# Patient Record
Sex: Female | Born: 1990 | Hispanic: Yes | Marital: Single | Smoking: Never smoker
Health system: Southern US, Community
[De-identification: ages and names within clinical notes are randomized; demographics above are authoritative.]

## PROBLEM LIST (undated history)

## (undated) ENCOUNTER — Inpatient Hospital Stay (HOSPITAL_COMMUNITY): Payer: Self-pay

## (undated) DIAGNOSIS — O0930 Supervision of pregnancy with insufficient antenatal care, unspecified trimester: Secondary | ICD-10-CM

## (undated) DIAGNOSIS — Z789 Other specified health status: Secondary | ICD-10-CM

## (undated) DIAGNOSIS — F329 Major depressive disorder, single episode, unspecified: Secondary | ICD-10-CM

## (undated) DIAGNOSIS — F32A Depression, unspecified: Secondary | ICD-10-CM

## (undated) DIAGNOSIS — Z86718 Personal history of other venous thrombosis and embolism: Secondary | ICD-10-CM

## (undated) HISTORY — DX: Major depressive disorder, single episode, unspecified: F32.9

## (undated) HISTORY — DX: Supervision of pregnancy with insufficient antenatal care, unspecified trimester: O09.30

## (undated) HISTORY — DX: Depression, unspecified: F32.A

## (undated) HISTORY — DX: Personal history of other venous thrombosis and embolism: Z86.718

## (undated) HISTORY — PX: OTHER SURGICAL HISTORY: SHX169

## (undated) HISTORY — PX: APPENDECTOMY: SHX54

---

## 2014-05-29 NOTE — L&D Delivery Note (Signed)
Delivery Note Patient admitted for SOL. Patient was noted to be GBS negative. At 2:10 PM a viable female was delivered via Vaginal, Spontaneous Delivery (Presentation: Right Occiput Posterior).  APGAR: 9, 9; weight 8 lb 11 oz (3941 g).   Placenta status: Intact, Spontaneous. Patient had retained products of conception requiring manual removal.  Cord: 3 vessels with the following complications: None.  Cord pH: None collected   Anesthesia: None   Episiotomy: None Lacerations: subcuticular tear  Suture Repair: 3.0 vicryl Est. Blood Loss (mL): 475  Mom to postpartum.  Baby to Couplet care / Skin to Skin.  Safiya Girdler Z Leon Montoya 04/28/2015, 3:21 PM

## 2014-10-25 ENCOUNTER — Inpatient Hospital Stay (HOSPITAL_COMMUNITY)
Admission: AD | Admit: 2014-10-25 | Discharge: 2014-10-25 | Disposition: A | Discharge status: Home / Self Care | Payer: Self-pay | Source: Ambulatory Visit | Attending: Family Medicine | Admitting: Family Medicine

## 2014-10-25 ENCOUNTER — Encounter (HOSPITAL_COMMUNITY): Payer: Self-pay | Admitting: *Deleted

## 2014-10-25 DIAGNOSIS — Z3A14 14 weeks gestation of pregnancy: Secondary | ICD-10-CM | POA: Insufficient documentation

## 2014-10-25 DIAGNOSIS — O26899 Other specified pregnancy related conditions, unspecified trimester: Secondary | ICD-10-CM

## 2014-10-25 DIAGNOSIS — R112 Nausea with vomiting, unspecified: Secondary | ICD-10-CM | POA: Insufficient documentation

## 2014-10-25 DIAGNOSIS — O98219 Gonorrhea complicating pregnancy, unspecified trimester: Secondary | ICD-10-CM

## 2014-10-25 DIAGNOSIS — O9989 Other specified diseases and conditions complicating pregnancy, childbirth and the puerperium: Secondary | ICD-10-CM

## 2014-10-25 DIAGNOSIS — R109 Unspecified abdominal pain: Secondary | ICD-10-CM

## 2014-10-25 DIAGNOSIS — O98212 Gonorrhea complicating pregnancy, second trimester: Secondary | ICD-10-CM | POA: Insufficient documentation

## 2014-10-25 DIAGNOSIS — O219 Vomiting of pregnancy, unspecified: Secondary | ICD-10-CM

## 2014-10-25 LAB — URINALYSIS, ROUTINE W REFLEX MICROSCOPIC
Bilirubin Urine: NEGATIVE
GLUCOSE, UA: NEGATIVE mg/dL
Ketones, ur: NEGATIVE mg/dL
Nitrite: NEGATIVE
PH: 8.5 — AB (ref 5.0–8.0)
Protein, ur: NEGATIVE mg/dL
Specific Gravity, Urine: 1.02 (ref 1.005–1.030)
UROBILINOGEN UA: 0.2 mg/dL (ref 0.0–1.0)

## 2014-10-25 LAB — WET PREP, GENITAL
TRICH WET PREP: NONE SEEN
Yeast Wet Prep HPF POC: NONE SEEN

## 2014-10-25 LAB — URINE MICROSCOPIC-ADD ON

## 2014-10-25 LAB — POCT PREGNANCY, URINE: PREG TEST UR: POSITIVE — AB

## 2014-10-25 MED ORDER — CEFTRIAXONE SODIUM 250 MG IJ SOLR: 250.0000 mg | INTRAMUSCULAR | Status: DC

## 2014-10-25 MED ORDER — AZITHROMYCIN 250 MG PO TABS
1000.0000 mg | ORAL_TABLET | Freq: Once | ORAL | Status: AC
  Administered 2014-10-25: 1000 mg via ORAL
  Filled 2014-10-25: qty 4

## 2014-10-25 MED ORDER — CEFTRIAXONE SODIUM 250 MG IJ SOLR
250.0000 mg | Freq: Once | INTRAMUSCULAR | Status: AC
  Administered 2014-10-25: 250 mg via INTRAMUSCULAR
  Filled 2014-10-25: qty 250

## 2014-10-25 MED ORDER — PROMETHAZINE HCL 25 MG PO TABS
25.0000 mg | ORAL_TABLET | Freq: Once | ORAL | Status: AC
  Administered 2014-10-25: 25 mg via ORAL
  Filled 2014-10-25: qty 1

## 2014-10-25 MED ORDER — PROMETHAZINE HCL 25 MG PO TABS: 25.0000 mg | ORAL_TABLET | Freq: Three times a day (TID) | ORAL | Status: DC | PRN

## 2014-10-25 NOTE — MAU Note (Signed)
Pt presents to MAU with complaints of pain in her right lower abdomen radiating into her back. Denies any vaginal bleeding

## 2014-10-25 NOTE — MAU Provider Note (Signed)
History     CSN: [id number]  Arrival date and time: 10/25/14 1138   First Provider Initiated Contact with Patient 10/25/14 1239      Chief Complaint  Patient presents with  . Positive UPT   . Vaginal Bleeding   HPI Lisa Massey 24 y.o. G2P1001 @[redacted]w[redacted]d  presents to MAU complaining of abdominal pain and back pain.  These symptoms started a couple weeks ago.  She [facility] not [facility] started [facility].  Her pain is worse on days when she has vomiting and improved on days when she is not vomiting.  She has tried no medications.  It hurts mostly in upper abdomen, and across mid-lower back.  She has nausea today.  On bad days, she vomits up to 4 times per day.  Denies vaginal bleeding, LOF, discharge, dysuria, fever, weakness.   OB History    Gravida Para Term Preterm AB TAB SAB Ectopic Multiple Living   2 1 1       1       History reviewed. No pertinent past medical history.  Past Surgical History  Procedure Laterality Date  . Appendectomy      History reviewed. No pertinent family history.  History  Substance Use Topics  . Smoking status: Never Smoker   . Smokeless tobacco: Never Used  . Alcohol Use: No    Allergies: No Known Allergies  Prescriptions prior to admission  Medication Sig Dispense Refill Last Dose  . Prenatal Vit-Fe Fumarate-FA (MULTIVITAMIN-PRENATAL) 27-0.8 MG TABS tablet Take 1 tablet by mouth daily at 12 noon.   10/25/2014 at Unknown time    ROS Pertinent ROS in HPI.  All other systems are negative.   Physical Exam   Blood pressure 116/53, pulse 76, temperature 98.2 F (36.8 C), resp. rate 18, height 5' 0.5" (1.537 m), weight 137 lb (62.143 kg), last menstrual period 07/16/2014.  Physical Exam  Constitutional: She is oriented to person, place, and time. She appears well-developed and well-nourished. No distress.  HENT:  Head: Normocephalic and atraumatic.  Eyes: EOM are normal.  Neck: Normal range of motion.  Cardiovascular: Normal rate and regular rhythm.    Respiratory: Effort normal and breath sounds normal. No respiratory distress.  GI: Soft. Bowel sounds are normal. She exhibits no distension. There is no tenderness. There is no rebound and no guarding.  Genitourinary:  Mod to large amt of yellowish homogenous discharge Erythema of cervical os No CMT, No adnexal mass or tenderness.  Musculoskeletal: Normal range of motion.  Neurological: She is alert and oriented to person, place, and time.  Skin: Skin is warm and dry.  Psychiatric: She has a normal mood and affect.   Results for orders placed or performed during the hospital encounter of 10/25/14 (from the past 24 hour(s))  Urinalysis, Routine w reflex microscopic (not [facility])     Status: Abnormal   Collection Time: 10/25/14 11:58 AM  Result Value Ref Range   Color, Urine YELLOW YELLOW   APPearance HAZY (A) CLEAR   Specific Gravity, Urine 1.020 1.005 - 1.030   pH 8.5 (H) 5.0 - 8.0   Glucose, UA NEGATIVE NEGATIVE mg/dL   Hgb urine dipstick TRACE (A) NEGATIVE   Bilirubin Urine NEGATIVE NEGATIVE   Ketones, ur NEGATIVE NEGATIVE mg/dL   Protein, ur NEGATIVE NEGATIVE mg/dL   Urobilinogen, UA 0.2 0.0 - 1.0 mg/dL   Nitrite NEGATIVE NEGATIVE   Leukocytes, UA MODERATE (A) NEGATIVE  Urine microscopic-add on     Status: Abnormal   Collection  Time: 10/25/14 11:58 AM  Result Value Ref Range   Squamous Epithelial / LPF FEW (A) RARE   WBC, UA 7-10 <3 WBC/hpf   RBC / HPF 0-2 <3 RBC/hpf   Bacteria, UA MANY (A) RARE   Urine-Other AMORPHOUS URATES/PHOSPHATES   Pregnancy, urine POC     Status: Abnormal   Collection Time: 10/25/14 12:08 PM  Result Value Ref Range   Preg Test, Ur POSITIVE (A) NEGATIVE  Wet prep, genital     Status: Abnormal   Collection Time: 10/25/14 12:50 PM  Result Value Ref Range   Yeast Wet Prep HPF POC NONE SEEN NONE SEEN   Trich, Wet Prep NONE SEEN NONE SEEN   Clue Cells Wet Prep HPF POC FEW (A) NONE SEEN   WBC, Wet Prep HPF POC MANY (A) NONE SEEN    MAU Course   Procedures  MDM Phenergan ordered to address nausea.  Pt requests oral.  Pt notes improvement of symptoms completely Wet prep and exam results discussed with pt.  Concern for possible gonorrhea infection.  Pt has chosen to be treated today for presumptive infection.  IM Rocephin + 1 gram azithromycin in MAU.    Assessment and Plan  A:  1. Abdominal pain in pregnancy   2. Gonorrhea affecting pregnancy   3.     Nausea/vomiting in preg  P: Discharge to home Phenergan Rx for nausea Obtain [facility] asap GC pending. Pt aware to not have sex x 10 days after treatment.  If positive - partner(s) will need to be treated also  Cont PNV qd OTC Tylenol for discomfort PRN Patient may return to MAU as needed or if her condition were to change or worsen   Bertram Denver 10/25/2014, 12:52 PM

## 2014-10-25 NOTE — Discharge Instructions (Signed)
Dolor abdominal en el embarazo (Abdominal Pain During Pregnancy) El dolor abdominal es frecuente durante el embarazo. Generalmente no causa ningn dao. El dolor abdominal puede tener numerosas causas. Algunas causas son ms graves que otras. Ciertas causas de dolor abdominal durante el embarazo se diagnostican fcilmente. A veces, se tarda un tiempo para llegar al diagnstico. Otras veces la causa no se conoce. El dolor abdominal puede estar relacionado con [place] alteracin del [place], o puede deberse a una causa totalmente diferente. Por este motivo, siempre consulte a su mdico cuando sienta molestias abdominales. INSTRUCCIONES PARA EL CUIDADO EN EL HOGAR  Est atenta al dolor para ver si hay cambios. Las siguientes indicaciones ayudarn a Psychologist, educational [employer] pueda sentir:  No Chiropodist sexuales y no coloque nada dentro de la vagina hasta que los sntomas hayan desaparecido completamente.  Descanse todo lo que pueda [employer] dolor se le haya calmado.  Si siente nuseas, beba lquidos claros. Evite los alimentos slidos mientras sienta malestar o tenga nuseas.  Tome slo medicamentos de venta libre o recetados, segn las indicaciones del mdico.  Cumpla con todas las visitas de control, segn le indique su mdico. SOLICITE ATENCIN MDICA DE INMEDIATO SI:  Tiene un sangrado, prdida de lquidos o elimina tejidos por la vagina.  El dolor o los clicos [place].  Tiene vmitos persistentes.  Comienza a Financial risk analyst al orinar u [employer].  Tiene fiebre.  Nota que los movimientos del beb disminuyen.  Siente intensa debilidad o se marea.  Tiene dificultad para respirar con o sin dolor abdominal.  Siente un dolor de cabeza intenso junto al dolor abdominal.  Shelle Iron secrecin vaginal anormal con dolor abdominal.  Tiene diarrea persistente.  El dolor abdominal sigue o empeora an despus de Field seismologist. ASEGRESE DE QUE:   Comprende estas  instrucciones.  Controlar su afeccin.  Recibir ayuda de inmediato si no mejora o si empeora. Document Released: 05/15/2005 Document Revised: 03/05/2013 [facility] Patient Information 2015 [place], [place]. This information is not intended to replace advice given to you by your health care provider. Make sure you discuss any questions you have with your health care provider. Bettey Mare (Gonorrhea) La gonorrea es una infeccin que puede causar graves problemas. Si la infeccin no se trata, puede tener estas consecuencias:   Producir daos en los rganos femeninos o masculinos.  Impedir que las mujeres queden embarazadas (esterilidad).  Daar el feto si la mujer infectada est embarazada. Es importante que reciba tratamiento para la gonorrea lo antes posible. Es necesario que todos sus compaeros sexuales sean examinados para diagnosticar la infeccin.  CAUSAS  La causa de la gonorrea es una bacteria llamada Neisseria gonorrhoeae. La infeccin se contagia de una persona a otra, generalmente por contacto sexual (por va anal, vaginal, u oral). Un recin nacido puede contraer la infeccin de su madre durante el [place].  SNTOMAS  Algunas personas con gonorrea no tienen sntomas. Los sntomas pueden ser diferentes en hombres y en mujeres.  Mujeres Los sntomas ms frecuentes son:   Radiographer, therapeutic parte baja del abdomen.  Tener fiebre con o sin escalofros. Otros sntomas son:   Flujo vaginal anormal.  Relaciones sexuales dolorosas.  Ardor o picazn en la vagina o en los labios.  Sangrado vaginal anormal.  Dolor al Beatrix Shipper.  Dolor de Set designer duracin (crnico) en la zona baja del abdomen, especialmente durante la menstruacin o las [employer].  Imposibilidad de quedar embarazada.  Trabajo de [employer].  Irritacin, dolor, sangrado o secrecin  por el recto. Esto puede ocurrir si la infeccin se contagi durante sexo anal.  Dolor de garganta o ganglios linfticos hinchados  en el cuello. Esto puede ocurrir si la infeccin se contagi durante sexo oral. Hombres Los sntomas ms frecuentes son:   Secrecin por el pene.  Sensacin de dolor o ardor al ConocoPhillipsorinar.  Dolor o hinchazn en los testculos. Otros sntomas son:   Irritacin, dolor, sangrado o secrecin por el recto. Esto puede ocurrir si la infeccin se contagi durante sexo anal.  Dolor de garganta, fiebre o ganglios linfticos hinchados en el cuello. Esto puede ocurrir si la infeccin se contagi durante sexo oral. DIAGNSTICO  El diagnstico se realiza luego de un examen fsico y de examinar una muestra de secreciones en el microscopio para detectar la presencia de la bacteria. La secrecin podr tomarse de la uretra, el cuello del tero, la garganta o el recto.  TRATAMIENTO  La gonorrea se trata con antibiticos. Es importante que inicie el tratamiento lo antes posible. El tratamiento precoz puede prevenir el desarrollo de otras enfermedades.  INSTRUCCIONES PARA EL CUIDADO EN EL HOGAR   Tome los medicamentos solamente como se lo haya indicado el mdico.  Tome los antibiticos como le indic el mdico. Termine los antibiticos aunque comience a sentirse mejor. Un tratamiento incompleto lo pondr en riesgo de continuar con la infeccin.  No tenga relaciones sexuales hasta completar el tratamiento, o segn las indicaciones del mdico.  Concurra a todas las visitas de control como se lo haya indicado el mdico.  No todos los resultados estarn disponibles durante su visita. Si los Terex Corporationresultados de los estudios no estn listos durante la visita, tenga otra entrevista con su mdico para conocerlos. No suponga que los resultados son normales si no tiene noticias del mdico o del centro mdico. Es su responsabilidad retirar el resultado del [place]estudio.  Si el estudio para la D.R. Horton, Incgonorrea le dio positivo, informe a sus Chiropractorcompaeros sexuales recientes. Ellos deben ITT Industrieshacerse los estudios para la gonorrea aunque no tengan  sntomas. Podran necesitar tratamiento aunque el resultado del estudio haya sido negativo para la [place]orrea. SOLICITE ATENCIN MDICA SI:   Tiene una reaccin adversa a los Research scientist (physical sciences)medicamentos que le prescribieron. Esta puede incluir:  Una erupcin.  Nuseas.  Vmitos.  Diarrea.  Sus sntomas no han mejorado despus de 3 das de tratamiento con antibiticos.  Los sntomas empeoran.  Aumenta el dolor en los testculos (los hombres) o en el abdomen (las mujeres).  Tiene fiebre. ASEGRESE DE QUE:   Comprende estas instrucciones.  Controlar su afeccin.  Recibir ayuda de inmediato si no mejora o si empeora. Document Released: 02/22/2005 Document Revised: 09/29/2013 [facility] Patient Information 2015 [place]. This information is not intended to replace advice given to you by your health care provider. Make sure you discuss any questions you have with your health care provider.

## 2014-10-26 LAB — CULTURE, OB URINE
CULTURE: NO GROWTH
Colony Count: NO GROWTH

## 2014-10-27 LAB — GC/CHLAMYDIA PROBE AMP (~~LOC~~) NOT AT ARMC
CHLAMYDIA, DNA PROBE: NEGATIVE
Neisseria Gonorrhea: NEGATIVE

## 2014-12-02 ENCOUNTER — Encounter: Payer: Self-pay | Admitting: Family Medicine

## 2015-01-04 ENCOUNTER — Other Ambulatory Visit (HOSPITAL_COMMUNITY): Payer: Self-pay | Admitting: Physician Assistant

## 2015-01-04 DIAGNOSIS — Z3689 Encounter for other specified antenatal screening: Secondary | ICD-10-CM

## 2015-01-04 DIAGNOSIS — Z3A25 25 weeks gestation of pregnancy: Secondary | ICD-10-CM

## 2015-01-04 LAB — OB RESULTS CONSOLE HIV ANTIBODY (ROUTINE TESTING)
HIV: NONREACTIVE
HIV: NONREACTIVE
HIV: NONREACTIVE

## 2015-01-04 LAB — OB RESULTS CONSOLE RPR: RPR: NONREACTIVE

## 2015-01-04 LAB — OB RESULTS CONSOLE ANTIBODY SCREEN: Antibody Screen: NEGATIVE

## 2015-01-04 LAB — OB RESULTS CONSOLE ABO/RH: RH Type: POSITIVE

## 2015-01-04 LAB — OB RESULTS CONSOLE GC/CHLAMYDIA
CHLAMYDIA, DNA PROBE: NEGATIVE
Gonorrhea: NEGATIVE

## 2015-01-04 LAB — OB RESULTS CONSOLE HEPATITIS B SURFACE ANTIGEN: Hepatitis B Surface Ag: NEGATIVE

## 2015-01-04 LAB — OB RESULTS CONSOLE RUBELLA ANTIBODY, IGM: Rubella: IMMUNE

## 2015-01-12 ENCOUNTER — Ambulatory Visit (HOSPITAL_COMMUNITY)
Admission: RE | Admit: 2015-01-12 | Discharge: 2015-01-12 | Disposition: A | Discharge status: Home / Self Care | Payer: Self-pay | Source: Ambulatory Visit | Attending: Physician Assistant | Admitting: Physician Assistant

## 2015-01-12 DIAGNOSIS — Z36 Encounter for antenatal screening of mother: Secondary | ICD-10-CM | POA: Insufficient documentation

## 2015-01-12 DIAGNOSIS — Z3A25 25 weeks gestation of pregnancy: Secondary | ICD-10-CM | POA: Insufficient documentation

## 2015-01-12 DIAGNOSIS — Z3689 Encounter for other specified antenatal screening: Secondary | ICD-10-CM

## 2015-01-12 IMAGING — US US MFM OB COMP +14 WKS
1 series · 12 of 28 positions shown · non-contrast
Comparison: none

[Series 1: us ob +14 all · 12 of 77 slices shown]
[im 3/77]
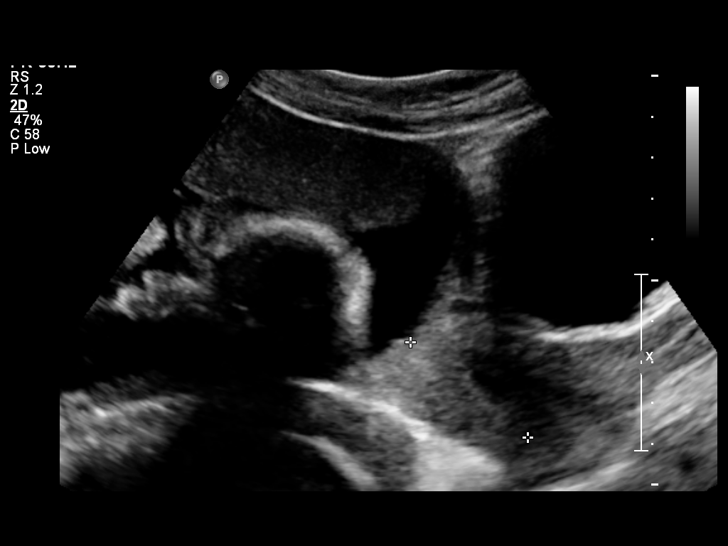
[im 9/77]
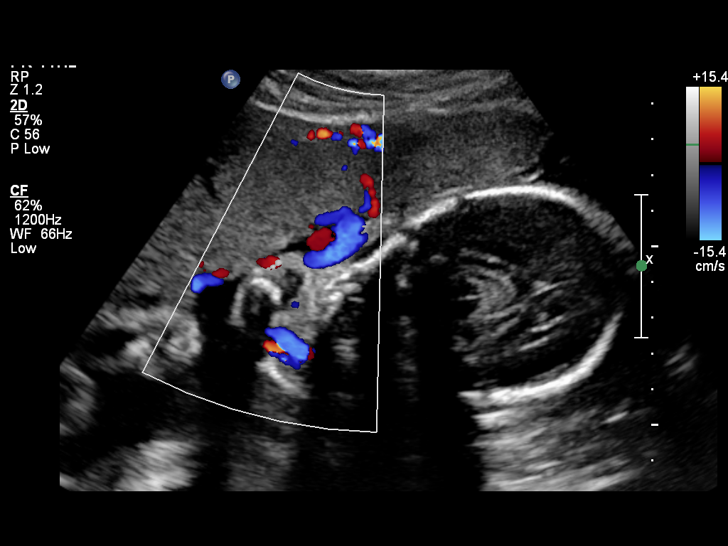
[im 15/77]
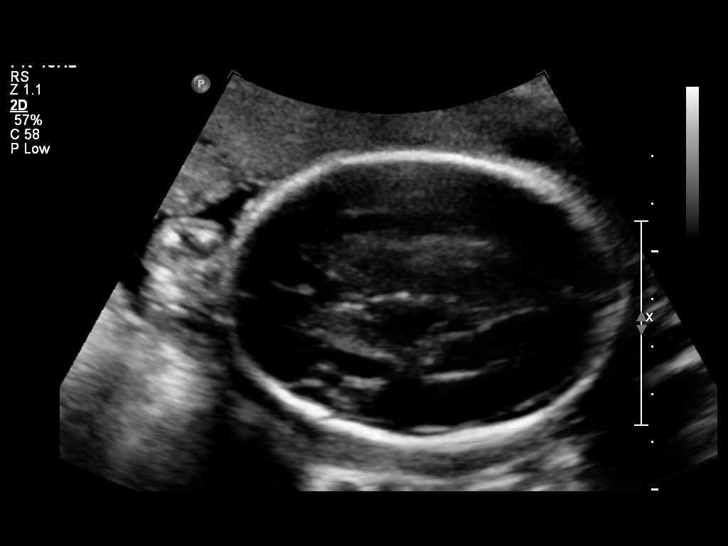
[im 23/77]
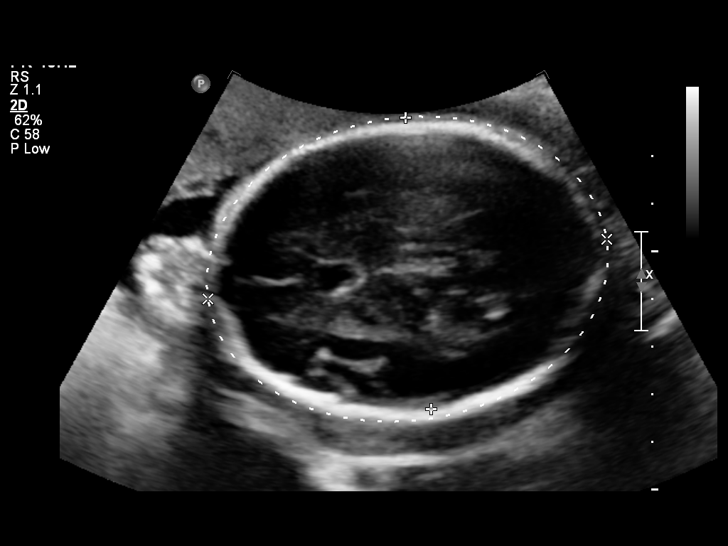
[im 29/77]
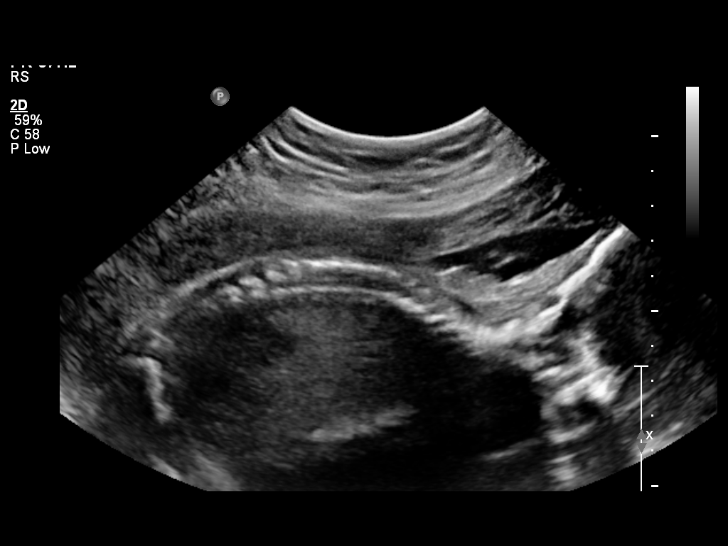
[im 34/77]
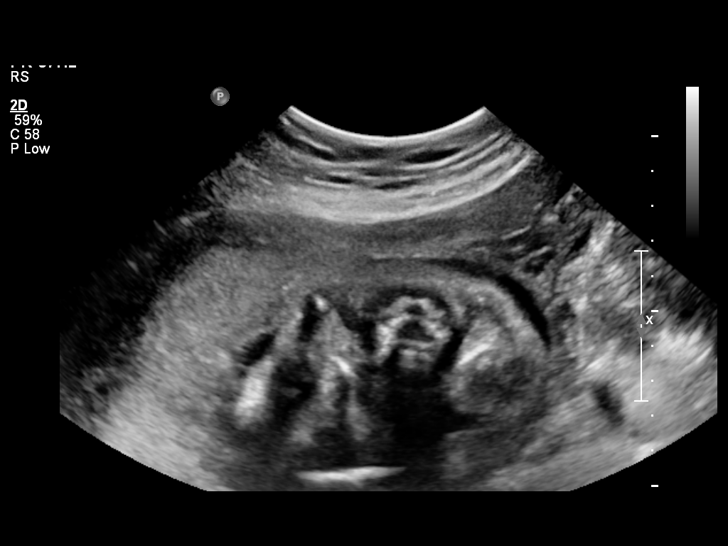
[im 43/77]
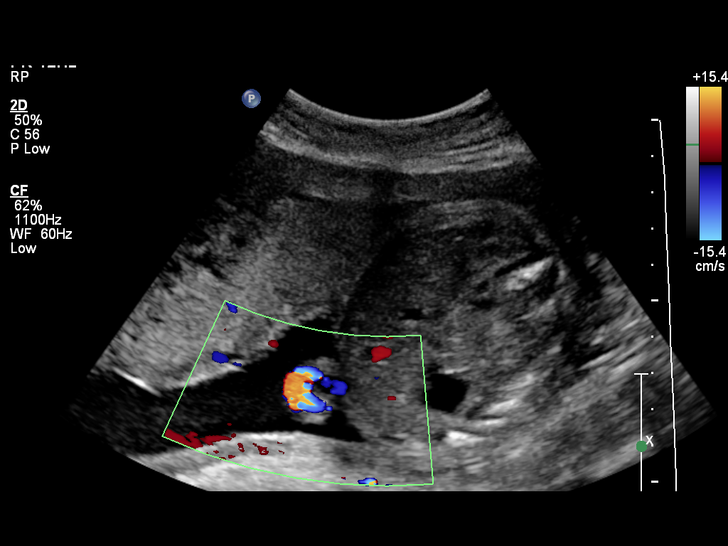
[im 48/77]
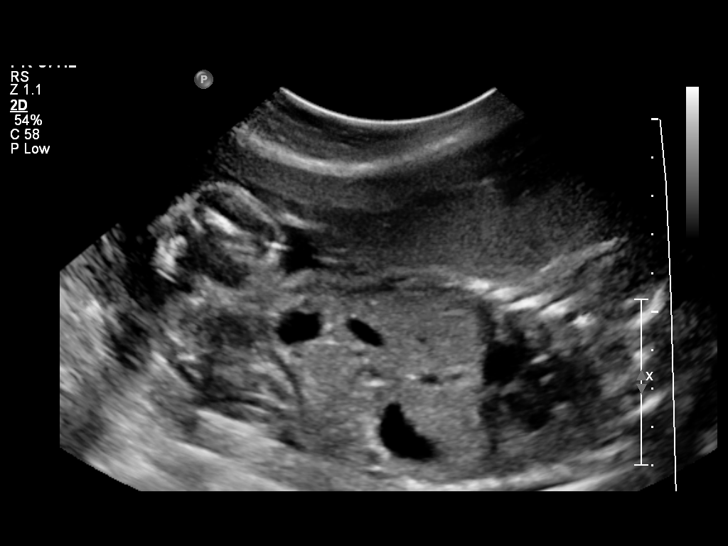
[im 54/77]
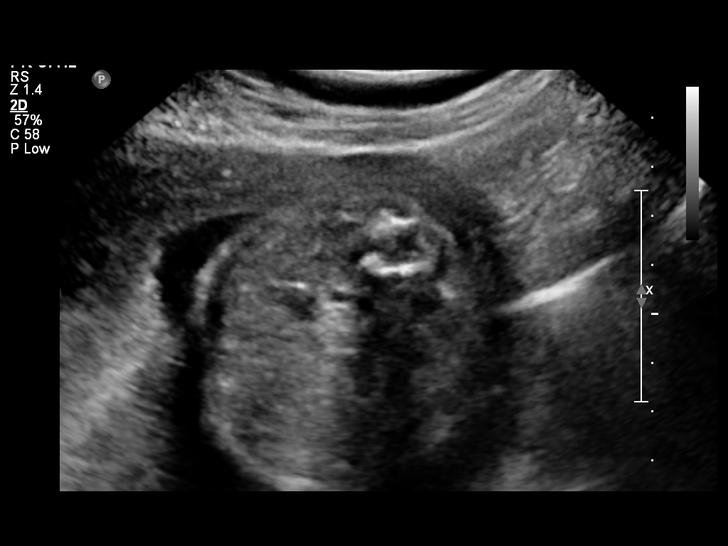
[im 62/77]
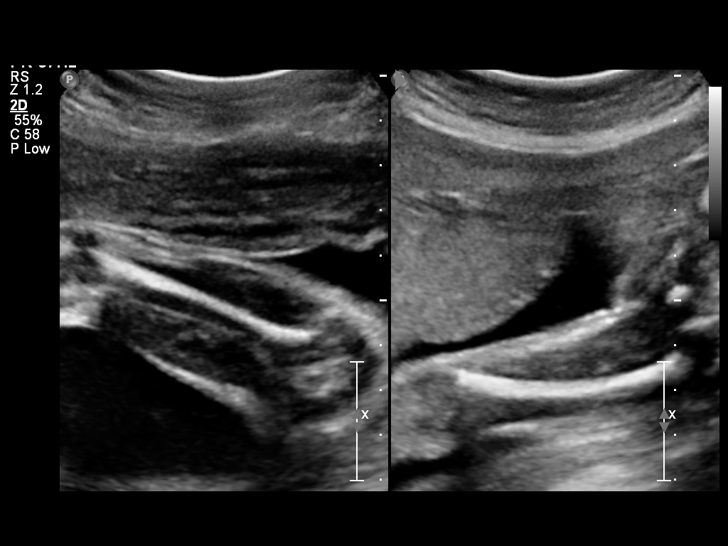
[im 68/77]
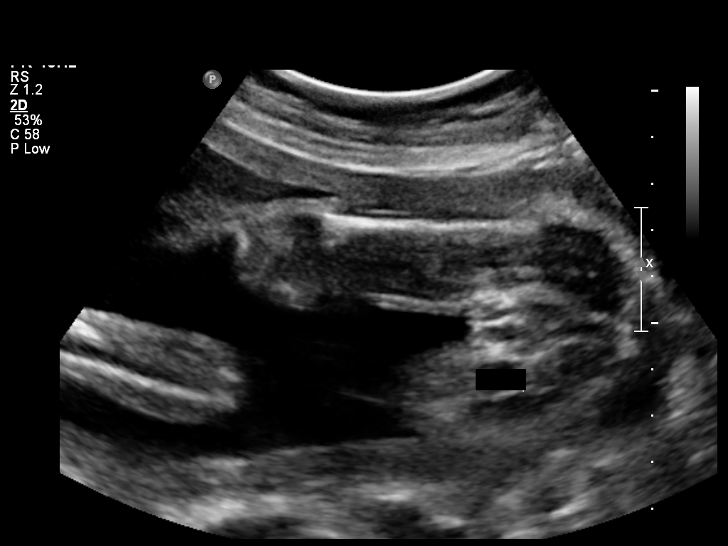
[im 74/77]
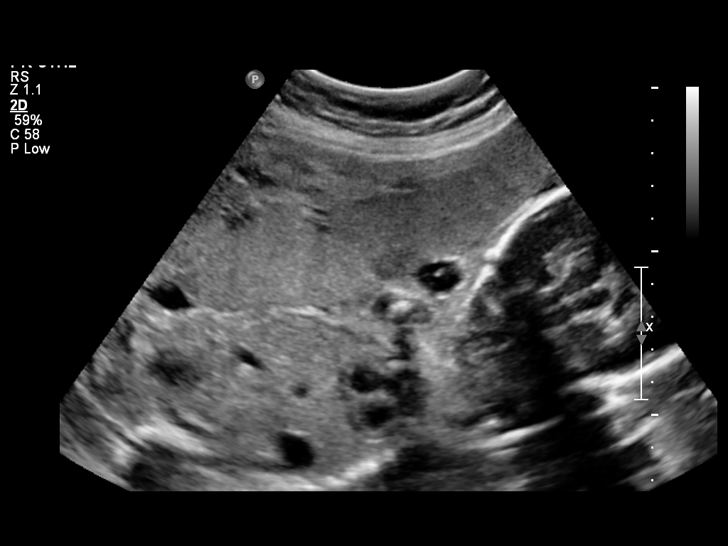

[12 of 28 positions shown; findings below may reference images not displayed]

OBSTETRICS REPORT
(Signed Final [DATE] [DATE])

Name:       TIGER                       Visit  [DATE] [DATE]
Date:

By:
Service(s) Provided

Indications

25 weeks gestation of pregnancy
Basic anatomic survey                                  [id number]
Fetal Evaluation

Num Of             1
Fetuses:
Fetal Heart        161                          bpm
Rate:
Cardiac Activity:  Observed
Presentation:      Cephalic
Placenta:          Anterior, above cervical
os
P. Cord            Visualized
Insertion:

Amniotic Fluid
AFI FV:      Subjectively within normal limits
Larg Pckt:      4.4  cm
Biometry

BPD:     61.1   m    G. Age:   24w 6d                 CI:        68.53   70 - 86
m
FL/HC:      20.9   18.6 -
20.4
HC:     235.9   m    G. Age:   25w 4d        24  %    HC/AC:      1.03   1.04 -
m
AC:     229.9   m    G. Age:   27w 3d        86  %    FL/BPD      80.7   71 - 87
m                                     :
FL:      49.3   m    G. Age:   26w 4d        64  %    FL/AC:      21.4   20 - 24
m
HUM:     46.6   m    G. Age:   27w 3d        86  %
m

Est.         980   gm    2 lb 3 oz      75   %
FW:
Gestational Age

LMP:           25w 5d        Date:  [DATE]                  EDD:   [DATE]
U/S Today:     26w 1d                                         EDD:   [DATE]
Best:          25w 5d    Det. By:   LMP  ([DATE])           EDD:   [DATE]
Anatomy

Cranium:          Appears normal         Aortic Arch:       Not well visualized
Fetal Cavum:      Appears normal         Ductal Arch:       Not well visualized
Ventricles:       Appears normal         Diaphragm:         Appears normal
Choroid Plexus:   Appears normal         Stomach:           Appears normal,
left sided
Cerebellum:       Appears normal         Abdomen:           Appears normal
Posterior         Appears normal         Abdominal          Appears nml (cord
Fossa:                                   Wall:              insert, abd wall)
Nuchal Fold:      Not applicable (>20    Cord Vessels:      Appears normal (3
wks GA)                                   vessel cord)
Face:             Not well visualized    Kidneys:           Appear normal
Lips:             Appears normal         Bladder:           Appears normal
Heart:            Appears normal         Spine:             Appears normal
(4CH, axis, and
situs)
RVOT:             Appears normal         Lower              Appears normal
Extremities:
LVOT:             Appears normal         Upper              Appears normal
Extremities:

Other:   Fetus appears to be a female. Heels and 5th digit visualized.
Technically difficult due to fetal position.
Targeted Anatomy

Fetal Central Nervous System
Cisterna
Magna:
Cervix Uterus Adnexa

Cervical Length:    3.68      cm

Cervix:       Normal appearance by transabdominal scan.

Left Ovary:    Not visualized. No adnexal mass visualized.
Right Ovary:   Not visualized. No adnexal mass visualized.
Impression

SIUP at 25+5 weeks
Normal detailed fetal anatomy; limited views of profile and
arches
Normal amniotic fluid volume
Measurements consistent with LMP dating

---------------------------------------------------------------------- Recommendations

Follow-up as clinically indicated

## 2015-03-25 LAB — OB RESULTS CONSOLE GC/CHLAMYDIA
Chlamydia: NEGATIVE
GC PROBE AMP, GENITAL: NEGATIVE

## 2015-03-25 LAB — OB RESULTS CONSOLE GBS: GBS: NEGATIVE

## 2015-04-27 ENCOUNTER — Encounter (HOSPITAL_COMMUNITY): Payer: Self-pay | Admitting: *Deleted

## 2015-04-27 ENCOUNTER — Telehealth (HOSPITAL_COMMUNITY): Payer: Self-pay | Admitting: *Deleted

## 2015-04-27 ENCOUNTER — Inpatient Hospital Stay (HOSPITAL_COMMUNITY)
Admission: AD | Admit: 2015-04-27 | Discharge: 2015-04-27 | Disposition: A | Discharge status: Home / Self Care | Payer: Self-pay | Source: Ambulatory Visit | Attending: Obstetrics & Gynecology | Admitting: Obstetrics & Gynecology

## 2015-04-27 DIAGNOSIS — Z3493 Encounter for supervision of normal pregnancy, unspecified, third trimester: Secondary | ICD-10-CM | POA: Insufficient documentation

## 2015-04-27 LAB — URINE MICROSCOPIC-ADD ON

## 2015-04-27 LAB — URINALYSIS, ROUTINE W REFLEX MICROSCOPIC
Bilirubin Urine: NEGATIVE
Glucose, UA: NEGATIVE mg/dL
KETONES UR: NEGATIVE mg/dL
NITRITE: NEGATIVE
Protein, ur: NEGATIVE mg/dL
SPECIFIC GRAVITY, URINE: 1.01 (ref 1.005–1.030)
pH: 6.5 (ref 5.0–8.0)

## 2015-04-27 NOTE — Progress Notes (Signed)
Written and verbal d/c instructions given with Eda interpreting and understanding voiced.

## 2015-04-27 NOTE — Discharge Instructions (Signed)
Braxton Hicks Contractions °Contractions of the uterus can occur throughout pregnancy. Contractions are not always a sign that you are in labor.  °WHAT ARE BRAXTON HICKS CONTRACTIONS?  °Contractions that occur before labor are called Braxton Hicks contractions, or false labor. Toward the end of pregnancy (32-34 weeks), these contractions can develop more often and may become more forceful. This is not true labor because these contractions do not result in opening (dilatation) and thinning of the cervix. They are sometimes difficult to tell apart from true labor because these contractions can be forceful and people have different pain tolerances. You should not feel embarrassed if you go to the hospital with false labor. Sometimes, the only way to tell if you are in true labor is for your health care provider to look for changes in the cervix. °If there are no prenatal problems or other health problems associated with the pregnancy, it is completely safe to be sent home with false labor and await the onset of true labor. °HOW CAN YOU TELL THE DIFFERENCE BETWEEN TRUE AND FALSE LABOR? °False Labor °· The contractions of false labor are usually shorter and not as hard as those of true labor.   °· The contractions are usually irregular.   °· The contractions are often felt in the front of the lower abdomen and in the groin.   °· The contractions may go away when you walk around or change positions while lying down.   °· The contractions get weaker and are shorter lasting as time goes on.   °· The contractions do not usually become progressively stronger, regular, and closer together as with true labor.   °True Labor °· Contractions in true labor last 30-70 seconds, become very regular, usually become more intense, and increase in frequency.   °· The contractions do not go away with walking.   °· The discomfort is usually felt in the top of the uterus and spreads to the lower abdomen and low back.   °· True labor can be  determined by your health care provider with an exam. This will show that the cervix is dilating and getting thinner.   °WHAT TO REMEMBER °· Keep up with your usual exercises and follow other instructions given by your health care provider.   °· Take medicines as directed by your health care provider.   °· Keep your regular prenatal appointments.   °· Eat and drink lightly if you think you are going into labor.   °· If Braxton Hicks contractions are making you uncomfortable:   °¨ Change your position from lying down or resting to walking, or from walking to resting.   °¨ Sit and rest in a tub of warm water.   °¨ Drink 2-3 glasses of water. Dehydration may cause these contractions.   °¨ Do slow and deep breathing several times an hour.   °WHEN SHOULD I SEEK IMMEDIATE MEDICAL CARE? °Seek immediate medical care if: °· Your contractions become stronger, more regular, and closer together.   °· You have fluid leaking or gushing from your vagina.   °· You have a fever.   °· You pass blood-tinged mucus.   °· You have vaginal bleeding.   °· You have continuous abdominal pain.   °· You have low back pain that you never had before.   °· You feel your baby's head pushing down and causing pelvic pressure.   °· Your baby is not moving as much as it used to.   °  °This information is not intended to replace advice given to you by your health care provider. Make sure you discuss any questions you have with your health care   provider. °  °Document Released: 05/15/2005 Document Revised: 05/20/2013 Document Reviewed: 02/24/2013 °Elsevier Interactive Patient Education ©2016 Elsevier Inc. ° °

## 2015-04-27 NOTE — Telephone Encounter (Signed)
Preadmission screen Interpreter number [phone]

## 2015-04-27 NOTE — MAU Note (Signed)
Pink spotting this am. Some lower abd pressure. Some burning when urinates since this am. Denies LOF. Good FM.

## 2015-04-27 NOTE — MAU Provider Note (Signed)
Pt up to BR

## 2015-04-27 NOTE — Progress Notes (Signed)
Dr Cathlean CowerMikell on unit and aware of u/a results

## 2015-04-27 NOTE — Progress Notes (Signed)
Dr Cathlean CowerMikell notified of pt's admission and status. Aware of ctx pattern, sve, dysuria and waiting for pt to void, reactive FHR. Will await u/a results

## 2015-04-28 ENCOUNTER — Inpatient Hospital Stay (HOSPITAL_COMMUNITY)
Admission: AD | Admit: 2015-04-28 | Discharge: 2015-04-30 | DRG: 767 | Disposition: A | Discharge status: Home / Self Care | Payer: Medicaid Other | Source: Ambulatory Visit | Attending: Obstetrics & Gynecology | Admitting: Obstetrics & Gynecology

## 2015-04-28 ENCOUNTER — Encounter (HOSPITAL_COMMUNITY): Payer: Self-pay | Admitting: *Deleted

## 2015-04-28 DIAGNOSIS — O26893 Other specified pregnancy related conditions, third trimester: Secondary | ICD-10-CM | POA: Diagnosis present

## 2015-04-28 DIAGNOSIS — Z8249 Family history of ischemic heart disease and other diseases of the circulatory system: Secondary | ICD-10-CM

## 2015-04-28 DIAGNOSIS — Z3A40 40 weeks gestation of pregnancy: Secondary | ICD-10-CM

## 2015-04-28 DIAGNOSIS — IMO0001 Reserved for inherently not codable concepts without codable children: Secondary | ICD-10-CM

## 2015-04-28 LAB — CBC
HEMATOCRIT: 39.9 % (ref 36.0–46.0)
HEMOGLOBIN: 13.8 g/dL (ref 12.0–15.0)
MCH: 28.5 pg (ref 26.0–34.0)
MCHC: 34.6 g/dL (ref 30.0–36.0)
MCV: 82.3 fL (ref 78.0–100.0)
PLATELETS: 217 10*3/uL (ref 150–400)
RBC: 4.85 MIL/uL (ref 3.87–5.11)
RDW: 15.4 % (ref 11.5–15.5)
WBC: 14.6 10*3/uL — AB (ref 4.0–10.5)

## 2015-04-28 LAB — RPR: RPR: NONREACTIVE

## 2015-04-28 LAB — CULTURE, OB URINE

## 2015-04-28 LAB — TYPE AND SCREEN
ABO/RH(D): B POS
Antibody Screen: NEGATIVE

## 2015-04-28 LAB — ABO/RH: ABO/RH(D): B POS

## 2015-04-28 MED ORDER — LACTATED RINGERS IV SOLN: 500.0000 mL | INTRAVENOUS | Status: DC | PRN

## 2015-04-28 MED ORDER — IBUPROFEN 600 MG PO TABS
600.0000 mg | ORAL_TABLET | Freq: Four times a day (QID) | ORAL | Status: DC
  Administered 2015-04-28 – 2015-04-30 (×9): 600 mg via ORAL
  Filled 2015-04-28 (×8): qty 1

## 2015-04-28 MED ORDER — OXYCODONE-ACETAMINOPHEN 5-325 MG PO TABS: 1.0000 | ORAL_TABLET | ORAL | Status: DC | PRN

## 2015-04-28 MED ORDER — METHYLERGONOVINE MALEATE 0.2 MG/ML IJ SOLN
INTRAMUSCULAR | Status: AC
  Administered 2015-04-28: 0.2 mg via INTRAMUSCULAR
  Filled 2015-04-28: qty 1

## 2015-04-28 MED ORDER — DIPHENHYDRAMINE HCL 25 MG PO CAPS: 25.0000 mg | ORAL_CAPSULE | Freq: Four times a day (QID) | ORAL | Status: DC | PRN

## 2015-04-28 MED ORDER — OXYTOCIN 40 UNITS IN LACTATED RINGERS INFUSION - SIMPLE MED
62.5000 mL/h | INTRAVENOUS | Status: DC
  Administered 2015-04-28: 62.5 mL/h via INTRAVENOUS
  Filled 2015-04-28: qty 1000

## 2015-04-28 MED ORDER — BENZOCAINE-MENTHOL 20-0.5 % EX AERO: 1.0000 "application " | INHALATION_SPRAY | CUTANEOUS | Status: DC | PRN

## 2015-04-28 MED ORDER — DIBUCAINE 1 % RE OINT: 1.0000 "application " | TOPICAL_OINTMENT | RECTAL | Status: DC | PRN

## 2015-04-28 MED ORDER — METHYLERGONOVINE MALEATE 0.2 MG/ML IJ SOLN
0.2000 mg | Freq: Once | INTRAMUSCULAR | Status: AC
  Administered 2015-04-28: 0.2 mg via INTRAMUSCULAR

## 2015-04-28 MED ORDER — FLEET ENEMA 7-19 GM/118ML RE ENEM: 1.0000 | ENEMA | RECTAL | Status: DC | PRN

## 2015-04-28 MED ORDER — WITCH HAZEL-GLYCERIN EX PADS: 1.0000 "application " | MEDICATED_PAD | CUTANEOUS | Status: DC | PRN

## 2015-04-28 MED ORDER — ACETAMINOPHEN 325 MG PO TABS: 650.0000 mg | ORAL_TABLET | ORAL | Status: DC | PRN

## 2015-04-28 MED ORDER — ONDANSETRON HCL 4 MG PO TABS: 4.0000 mg | ORAL_TABLET | ORAL | Status: DC | PRN

## 2015-04-28 MED ORDER — LACTATED RINGERS IV SOLN
INTRAVENOUS | Status: DC
Start: 2015-04-28 — End: 2015-04-28
  Administered 2015-04-28 (×2): via INTRAVENOUS

## 2015-04-28 MED ORDER — OXYTOCIN BOLUS FROM INFUSION: 500.0000 mL | INTRAVENOUS | Status: DC

## 2015-04-28 MED ORDER — OXYCODONE-ACETAMINOPHEN 5-325 MG PO TABS: 2.0000 | ORAL_TABLET | ORAL | Status: DC | PRN

## 2015-04-28 MED ORDER — CITRIC ACID-SODIUM CITRATE 334-500 MG/5ML PO SOLN: 30.0000 mL | ORAL | Status: DC | PRN

## 2015-04-28 MED ORDER — SENNOSIDES-DOCUSATE SODIUM 8.6-50 MG PO TABS
2.0000 | ORAL_TABLET | ORAL | Status: DC
  Administered 2015-04-28 – 2015-04-30 (×2): 2 via ORAL
  Filled 2015-04-28 (×2): qty 2

## 2015-04-28 MED ORDER — FENTANYL CITRATE (PF) 100 MCG/2ML IJ SOLN
INTRAMUSCULAR | Status: AC
  Administered 2015-04-28: 100 ug via INTRAVENOUS
  Filled 2015-04-28: qty 2

## 2015-04-28 MED ORDER — CEFAZOLIN SODIUM-DEXTROSE 2-3 GM-% IV SOLR
2.0000 g | Freq: Once | INTRAVENOUS | Status: AC
  Administered 2015-04-28: 2 g via INTRAVENOUS
  Filled 2015-04-28: qty 50

## 2015-04-28 MED ORDER — ONDANSETRON HCL 4 MG/2ML IJ SOLN
4.0000 mg | Freq: Four times a day (QID) | INTRAMUSCULAR | Status: DC | PRN
  Administered 2015-04-28 (×2): 4 mg via INTRAVENOUS
  Filled 2015-04-28 (×2): qty 2

## 2015-04-28 MED ORDER — TETANUS-DIPHTH-ACELL PERTUSSIS 5-2.5-18.5 LF-MCG/0.5 IM SUSP: 0.5000 mL | Freq: Once | INTRAMUSCULAR | Status: DC

## 2015-04-28 MED ORDER — LIDOCAINE HCL (PF) 1 % IJ SOLN
30.0000 mL | INTRAMUSCULAR | Status: AC | PRN
  Administered 2015-04-28: 30 mL via SUBCUTANEOUS
  Filled 2015-04-28: qty 30

## 2015-04-28 MED ORDER — LANOLIN HYDROUS EX OINT: TOPICAL_OINTMENT | CUTANEOUS | Status: DC | PRN

## 2015-04-28 MED ORDER — ONDANSETRON HCL 4 MG/2ML IJ SOLN: 4.0000 mg | INTRAMUSCULAR | Status: DC | PRN

## 2015-04-28 MED ORDER — ZOLPIDEM TARTRATE 5 MG PO TABS: 5.0000 mg | ORAL_TABLET | Freq: Every evening | ORAL | Status: DC | PRN

## 2015-04-28 MED ORDER — FENTANYL CITRATE (PF) 100 MCG/2ML IJ SOLN
50.0000 ug | INTRAMUSCULAR | Status: DC | PRN
  Administered 2015-04-28 (×6): 100 ug via INTRAVENOUS
  Filled 2015-04-28 (×5): qty 2

## 2015-04-28 MED ORDER — METHYLERGONOVINE MALEATE 0.2 MG PO TABS
0.2000 mg | ORAL_TABLET | Freq: Four times a day (QID) | ORAL | Status: AC
  Administered 2015-04-28 – 2015-04-29 (×5): 0.2 mg via ORAL
  Filled 2015-04-28 (×5): qty 1

## 2015-04-28 MED ORDER — SIMETHICONE 80 MG PO CHEW: 80.0000 mg | CHEWABLE_TABLET | ORAL | Status: DC | PRN

## 2015-04-28 MED ORDER — PRENATAL MULTIVITAMIN CH
1.0000 | ORAL_TABLET | Freq: Every day | ORAL | Status: DC
  Administered 2015-04-29 – 2015-04-30 (×2): 1 via ORAL
  Filled 2015-04-28 (×2): qty 1

## 2015-04-28 NOTE — Lactation Note (Addendum)
This note was copied from the chart of Lisa Massey. Lactation Consultation Note Initial visit at 7 hours of age with spanish interpreter.  Mom reports a recent feeding and denies pain with latch.  Mom has experience with older child nursing for 10 months.  [facility] resources given and discussed.  Encouraged to feed with early cues on demand.  Early newborn behavior discussed.  Hand expression demonstrated by mom with colostrum visible. Discussed exclusivety and encouraged mom to hand express and offer spoon feeding if needed. Fob at bedside supportive.    Mom to call for assist as needed.     Patient Name: Lisa Massey Today's Date: 04/28/2015 Reason for consult: Initial assessment   Maternal Data Has patient been taught Hand Expression?: Yes Does the patient have breastfeeding experience prior to this delivery?: Yes  Feeding    LATCH Score/Interventions                Intervention(s): Breastfeeding basics reviewed     Lactation Tools Discussed/Used WIC Program: Yes   Consult Status Consult Status: Follow-up Date: 04/29/15 Follow-up type: In-patient    Enis Leatherwood, Arvella MerlesJana Lynn 04/28/2015, 10:00 PM

## 2015-04-28 NOTE — Progress Notes (Signed)
Delivery of live viable female by Dr Cathlean CowerMikell. APGARs 9,9

## 2015-04-28 NOTE — H&P (Signed)
Lisa Massey is a 24 y.o. female G2P1001 with IUP at [redacted]w[redacted]d presenting for contractions. Pt states she has been having regular, every 5-7 minutes contractions, associated with none vaginal bleeding.  Membranes are intact, with active fetal movement.   [facility] at [facility] since 24 wks  Prenatal History/Complications: - Late PNC - LGA Infant 9lb4oz. - Untreated Depression - Language Barrier  U/S: [redacted]w[redacted]d - normal fetal anatomy. 75% EFW, (980g)  Past Medical History: Past Medical History  Diagnosis Date  . Late prenatal care   . Depression denies ever having depression    Past Surgical History: Past Surgical History  Procedure Laterality Date  . Appendectomy      Obstetrical History: OB History    Gravida Para Term Preterm AB TAB SAB Ectopic Multiple Living   Social History: Social History   Social History  . Marital Status: Single    Spouse Name: N/A  . Number of Children: N/A  . Years of Education: N/A   Social History Main Topics  . Smoking status: Never Smoker   . Smokeless tobacco: Never Used  . Alcohol Use: No  . Drug Use: No  . Sexual Activity: Yes   Other Topics Concern  . None   Social History Narrative    Family History: Family History  Problem Relation Age of Onset  . Hypertension Father   . Cancer Sister     Allergies: No Known Allergies  Prescriptions prior to admission  Medication Sig Dispense Refill Last Dose  . Prenatal Vit-Fe Fumarate-FA (MULTIVITAMIN-PRENATAL) 27-0.8 MG TABS tablet Take 1 tablet by mouth daily at 12 noon.   04/27/2015 at Unknown time     Review of Systems   Constitutional: Negative  Blood pressure 123/80, pulse 102, resp. rate 18, height 5' (1.524 m), weight 75.751 kg (167 lb), last menstrual period 07/16/2014, SpO2 100 %. General appearance: alert, cooperative and no distress Lungs: clear to auscultation bilaterally Heart: regular rate and rhythm Abdomen: soft, non-tender; bowel sounds  normal Pelvic: 4.5cm / 80% / -2 Extremities: Homans sign is negative, no sign of DVT Presentation: cephalic Fetal monitoringBaseline: 130 bpm, Variability: Good {> 6 bpm) and Accelerations: Reactive Uterine activityFrequency: Every 3-5 minutes Dilation: 4.5 Effacement (%): 90 Station: -2 Exam by:: Dr. Danley Danker   Prenatal labs: ABO, Rh: B/Positive/-- (08/08 0000) Antibody: Negative (08/08 0000) Rubella: !Error! RPR: Nonreactive (08/08 0000)  HBsAg: Negative (08/08 0000)  HIV: Non-reactive (08/08 0000)  GBS: Negative (10/27 0000)  1 hr Glucola 99 Genetic screening  Too late.  Anatomy US normal.    Prenatal Transfer Tool  Maternal Diabetes: No Genetic Screening: too late.  Maternal Ultrasounds/Referrals: Normal Fetal Ultrasounds or other Referrals:  None Maternal Substance Abuse:  No Significant Maternal Medications:  None Significant Maternal Lab Results: Lab values include: Group B Strep negative     Results for orders placed or performed during the hospital encounter of 04/27/15 (from the past 24 hour(s))  Urinalysis, Routine w reflex microscopic (not at [facility])   Collection Time: 04/27/15 11:40 AM  Result Value Ref Range   Color, Urine YELLOW YELLOW   APPearance CLEAR CLEAR   Specific Gravity, Urine 1.010 1.005 - 1.030   pH 6.5 5.0 - 8.0   Glucose, UA NEGATIVE NEGATIVE mg/dL   Hgb urine dipstick SMALL (A) NEGATIVE   Bilirubin Urine NEGATIVE NEGATIVE   Ketones, ur NEGATIVE NEGATIVE mg/dL   Protein, ur NEGATIVE NEGATIVE mg/dL  Nitrite NEGATIVE NEGATIVE   Leukocytes, UA TRACE (A) NEGATIVE  Urine microscopic-add on   Collection Time: 04/27/15 11:40 AM  Result Value Ref Range   Squamous Epithelial / LPF 0-5 (A) NONE SEEN   WBC, UA 0-5 0 - 5 WBC/hpf   RBC / HPF 0-5 0 - 5 RBC/hpf   Bacteria, UA RARE (A) NONE SEEN    Assessment: Lisa Massey is a 24 y.o. G2P1001 at 3545w6d by 25w u/s here for active labor.  #Labor: Active labor. Expectant mgmt. Augment as  needed.  #Pain: Fentanyl prn, no epidural.  #FWB: Cat I #ID:  GBS neg.  #MOF: Breast #MOC:Depo #Circ:  Girl.   Lisa Massey 04/28/2015, 4:09 AM   OB fellow attestation: I have seen and examined this patient; I agree with above documentation in the resident's note.   [name] is a 24 y.o. G2P1001 here for active labor/transition to active labor  PE: BP 112/78 mmHg  Pulse 121  Temp(Src) 98.4 F (36.9 C) (Oral)  Resp 18  Ht 5' (1.524 m)  Wt 167 lb (75.751 kg)  BMI 32.62 kg/m2  SpO2 100%  LMP 07/16/2014 Gen: calm comfortable, NAD Resp: normal effort, no distress Abd: gravid  ROS, labs, PMH reviewed  Plan: Admit to lD, expectant management Prn IV meds GBS pos- start amipicillin Mood d/o: SW and baby love involvement pp Federico FlakeKimberly Niles Blondell Laperle, MD Family Medicine, OB Fellow 04/28/2015, 4:56 AM

## 2015-04-28 NOTE — MAU Note (Signed)
Pt presents for contractions 

## 2015-04-29 ENCOUNTER — Inpatient Hospital Stay (HOSPITAL_COMMUNITY): Admission: RE | Admit: 2015-04-29 | Payer: Self-pay | Source: Ambulatory Visit

## 2015-04-29 LAB — CBC
HEMATOCRIT: 31.5 % — AB (ref 36.0–46.0)
Hemoglobin: 10.5 g/dL — ABNORMAL LOW (ref 12.0–15.0)
MCH: 27.9 pg (ref 26.0–34.0)
MCHC: 33.3 g/dL (ref 30.0–36.0)
MCV: 83.6 fL (ref 78.0–100.0)
PLATELETS: 196 10*3/uL (ref 150–400)
RBC: 3.77 MIL/uL — ABNORMAL LOW (ref 3.87–5.11)
RDW: 15.5 % (ref 11.5–15.5)
WBC: 15.1 10*3/uL — AB (ref 4.0–10.5)

## 2015-04-29 NOTE — Progress Notes (Signed)
CLINICAL SOCIAL WORK MATERNAL/CHILD NOTE  Patient Details  Name: Lisa Massey MRN: [id number] Date of Birth: 04/28/2016  Date:  04/29/2015  Clinical Social Worker Initiating Note:  Elissa Hefty, MSW intenr   Date/ Time Initiated:  04/29/15/1250     Child's Name:  [place]    Legal Guardian:  Harvie Heck and Maralyn Sago    Need for Interpreter:  Spanish   Date of Referral:  04/28/15     Reason for Referral:  Behavioral Health Issues, including SI , Late or No Prenatal Care    Referral Source:  [facility]   Address:  [address] [place], [place] [address]  Phone number:  [phone]   Household Members:  Self, Spouse, Minor Children, Parents, Siblings   Natural Supports (not living in the home):  Immediate Family, Friends, Parent, Spouse/significant other   Professional Supports: None   Employment: Unemployed   Type of Work:     Education:  Database administrator Resources:      Other Resources:      Cultural/Religious Considerations Which May Impact Care: None Reported   Strengths:  Ability to meet basic needs , Home prepared for child    Risk Factors/Current Problems:   Mental Health Concerns- MOB presents with a history of depression and was referred to [facility] by her OB. MOB shared she only attended one session because the therapist did not see her symptoms as a concern for her during her pregnancy. MOB shared they just moved here from [place] so the move impacted her along with restarting the relationship with her father and sister she had not seen in five years. MOB reported that the move also impacted her prenatal care because of difficulty attaining Medicaid and financial expenses.   Cognitive State:  Able to Concentrate , Alert , Linear Thinking    Mood/Affect:  Flat , Comfortable , Calm , Relaxed    CSW Assessment:  MSW intern presented in patient's room due to a history of depression and late  prenatal care. FOB was present in the room and MOB provided verbal consent for MSW intern to engage. Both MOB and FOB only spoke Spanish, so MSW intern conducted the assessment in Spanish per their request. MOB presented to be in a pleasant mood as evidence by breastfeeding her infant but was very flat and vague with her answers. Per MOB, the birthing process went well and she is transitioning well into postpartum. MOB shared she is breastfeeding the infant and did not voice any concerns. According to MOB, they live with her father in sister and just recently moved from [place], [place] in May. FOB shared the move was due to the weather in [place] interfering with his job security. MOB voiced that she does not really like the area but has slowly adjusted to the move. MSW intern asked MOB if the moved interfered with her prenatal care. FOB shared that MOB was unaware that she was pregnant until shortly before they moved in May and then when they got here because of their legal status it was difficult to acquire insurance. MOB shared she was given Medicaid for two months and therefore was able to initiate her prenatal care at the health department. However, MOB reported once she lost her Medicaid it was hard on them to pay for her prenatal visits out of pocket.   MOB stated that she has met all of the infants basic needs and has the home  prepared for the infant. FOB disclosed that he just recently lost his job here but has not looked for employment yet in order to help MOB transition back into the home. FOB shared feelings of stress but voiced taking it one day at a time and only focusing on helping MOB at home and with the infant. MOB shared she has a great support system and FOB stated they have friends as well to help them out. FOB plans to seek employment in the near future. MOB shared she has a 25 year old son as well and that he is very excited about having a little sister.   MSW intern inquired about MOB's  mental health. MOB shared that she was feeling sad at the begining of her pregnancy because of them having to move to [place] from [place] and also some tension between her family relationships. MOB shared she had not seen her family in over five years so it was very difficult for her to restart the relationship and communicate with them. Out of concern about how she was feeling, MOB requested to see a therapist and her OB referred her to [facility]. MOB disclosed that she attended one therapy session with Bjorn Pippin but denied being prescribed any medications. MOB shared that the social worker did not view her symptoms as a concern but told her to contact her if needs arise. MOB shared that she has been fine since and the relationship with her family has gotten better since they all now live together. MOB denied any further feelings of depression or anxiety prior or during the pregnancy. MSW intern provided education on perinatal mood disorders. MOB and FOB denied having any further questions but MOB agreed to contact KIm Herzing if needs arise since she had met with her before. MSW intern went over some CBT techniques MOB could practice at home in order to help her cope with any future feelings of sadness or anxiety.    MOB and FOB denied having any further questions but agreed to contact MSW intern if needs arise.     CSW Plan/Description:  Engineer, mining- MSW intern provided education on perinatal mood disorders.  No Further Intervention Required/No Barriers to Discharge    Trevor Iha, Student-SW 04/29/2015, 1:15 PM

## 2015-04-29 NOTE — Progress Notes (Signed)
Patient instructed on pp bleeding and to report excessive bleeding to the nurse. Pt indicated understanding of normal and excessive bleeding.  She reports that the bleeding was much better now than it was initially.

## 2015-04-29 NOTE — Lactation Note (Signed)
This note was copied from the chart of Lisa Massey. Lactation Consultation Note  Patient Name: Lisa Massey MVHQI'OToday's Date: 04/29/2015 Reason for consult: Follow-up assessment  Baby 25 hours old. Used [employer] Spanish interpreter "Reuel BoomDaniel" [phone]#[id number]. Mom reports that she nursed her first child (a 25-year-old) for 10 months without any issues. Mom states that baby just finished nursing within the last hour and mom states that she heard swallows and baby seemed satisfied after nursing and fell asleep. Assisted mom to hand express and then demonstrated how to spoon-feed EBM (3ml) to baby. Enc mom to hand express after nursing if she feels baby not nursing well or not satisfied, and then give additional EBM to baby with spoon. Assisted mom to latch baby in cross-cradle position to left breast and baby latched deeply, suckling rhythmically with a few swallows noted. Baby able to maintain a deep latch for 5 minutes, but baby seems to be satisfied from last feeding and not interested if nursing right now.   Enc mom to keep baby STS and nurse with cues. Enc mom to hand express for additional EBM as needed. Patient's evening bedside RN, Lisa Massey came into room while this [facility], so discussed with Lisa Massey that baby seems to be satisfied and mom seems to have good flow of colostrum. Also discussed plan to supplement EBM as needed.  Maternal Data    Feeding Feeding Type: Breast Fed Length of feed: 5 min  LATCH Score/Interventions Latch: Grasps breast easily, tongue down, lips flanged, rhythmical sucking.  Audible Swallowing: A few with stimulation Intervention(s): Skin to skin;Hand expression  Type of Nipple: Everted at rest and after stimulation  Comfort (Breast/Nipple): Soft / non-tender     Hold (Positioning): Assistance needed to correctly position infant at breast and maintain latch. Intervention(s): Breastfeeding basics reviewed;Support Pillows;Skin to skin  LATCH  Score: 8  Lactation Tools Discussed/Used Tools: Pump Breast pump type: Manual   Consult Status Consult Status: Follow-up Date: 04/30/15 Follow-up type: In-patient    Geralynn OchsWILLIARD, Lisa Massey 04/29/2015, 3:39 PM

## 2015-04-29 NOTE — Progress Notes (Signed)
Post Partum Day 1 Subjective:  Lisa Massey is a 24 y.o. [phone] 6160w6d s/p SVD and manual extraction of retained products.  No acute events overnight.  Pt denies problems with ambulating, voiding or po intake.  She denies nausea or vomiting.  Pain is well controlled.  She has not had flatus.  Lochia Small and slowing from yesterday.  Plan for birth control is Depo-Provera.  Method of Feeding: Breast  Objective: Blood pressure 109/62, pulse 82, temperature 98.3 F (36.8 C), temperature source Oral, resp. rate 18, height 5' (1.524 m), weight 75.751 kg (167 lb), last menstrual period 07/16/2014, SpO2 100 %, unknown if currently breastfeeding.  Physical Exam:  General: alert, cooperative and no distress Lochia:normal flow Chest: normal WOB Heart: Regular rate Abdomen: +BS, soft, mild TTP (appropriate) Uterine Fundus: firm, at the umbilicus DVT Evaluation: No evidence of DVT seen on physical exam; negative Homans sign Extremities: no edema   Recent Labs  04/28/15 0355 04/29/15 0549  HGB 13.8 10.5*  HCT 39.9 31.5*    Assessment/Plan:  ASSESSMENT: Lisa Massey is a 24 y.o. [id number] 6160w6d s/p SVD with retained placental products. She is doing well today and reports that her bleeding has improved since yesterday. She reported some leg pain while ambulating yesterday afternoon but reports this has gone away now. Post-partum hemoglobin level this morning reassuring. Will continue with methergine for six total doses for PP trickle.  Plan for discharge tomorrow Continue routine PP care Breastfeeding support PRN  LOS: 1 day   Henrietta HooverWarren M Taylor 04/29/2015, 7:50 AM   CNM attestation Post Partum Day #1  Lisa Massey is a 24 y.o. [id number] s/p SVD.  Pt denies problems with ambulating, voiding or po intake. Pain is well controlled.  Plan for birth control is Depo-Provera.  Method of Feeding: breast  PE:  BP 109/62 mmHg  Pulse 82  Temp(Src) 98.3 F (36.8 C) (Oral)  Resp 18  Ht 5'  (1.524 m)  Wt 75.751 kg (167 lb)  BMI 32.62 kg/m2  SpO2 100%  LMP 07/16/2014  Breastfeeding? Unknown Fundus firm  Plan for discharge: 04/30/15  Cam HaiSHAW, KIMBERLY, CNM 8:35 AM  04/29/2015

## 2015-04-29 NOTE — Progress Notes (Signed)
Patient instructed on breastfeeding the baby with language line interpreter.

## 2015-04-30 MED ORDER — IBUPROFEN 600 MG PO TABS: 600.0000 mg | ORAL_TABLET | Freq: Four times a day (QID) | ORAL | Status: DC

## 2015-04-30 NOTE — Discharge Instructions (Signed)
Parto vaginal, Cuidados posteriores  °(Vaginal Delivery, Care After) °Siga estas instrucciones durante las próximas semanas. Estas indicaciones para el alta le proporcionan información general acerca de cómo deberá cuidarse después del parto. El médico también podrá darle instrucciones específicas. El tratamiento ha sido planificado según las prácticas médicas actuales, pero en algunos casos pueden ocurrir problemas. Comuníquese con el médico si tiene algún problema o tiene preguntas al volver a su casa.  °INSTRUCCIONES PARA EL CUIDADO EN EL HOGAR  °· Tome sólo medicamentos de venta libre o recetados, según las indicaciones del médico o del farmacéutico. °· No beba alcohol, especialmente si está amamantando o toma analgésicos. °· No mastique tabaco ni fume. °· No consuma drogas. °· Continúe con un adecuado cuidado perineal. El buen cuidado perineal incluye: °¨ Higienizarse de adelante hacia atrás. °¨ Mantener la zona perineal limpia. °· No use tampones ni duchas vaginales hasta que su médico la autorice. °· Dúchese, lávese el cabello y tome baños de inmersión según las indicaciones de su médico. °· Utilice un sostén que le ajuste bien y que brinde buen soporte a sus mamas. °· Consuma alimentos saludables. °· Beba suficiente líquido para mantener la orina clara o de color amarillo pálido. °· Consuma alimentos ricos en fibra como cereales y panes integrales, arroz, frijoles y frutas y verduras frescas todos los días. Estos alimentos pueden ayudarla a prevenir o aliviar el estreñimiento. °· Siga las recomendaciones de su médico relacionadas con la reanudación de actividades como subir escaleras, conducir automóviles, levantar objetos, hacer ejercicios o viajar. °· Hable con su médico acerca de reanudar la actividad sexual. Volver a la actividad sexual depende del riesgo de infección, la velocidad de la curación y la comodidad y su deseo de reanudarla. °· Trate de que alguien la ayude con las actividades del hogar y con  el recién nacido al menos durante un par de días después de salir del hospital. °· Descanse todo lo que pueda. Trate de descansar o tomar una siesta mientras el bebé está durmiendo. °· Aumente sus actividades gradualmente. °· Cumpla con todas las visitas de control programadas para después del parto. Es muy importante asistir a todas las citas programadas de seguimiento. En estas citas, su médico va a controlarla para asegurarse de que esté sanando física y emocionalmente. °SOLICITE ATENCIÓN MÉDICA SI:  °· Elimina coágulos grandes por la vagina. Guarde algunos coágulos para mostrarle al médico. °· Tiene una secreción con feo olor que proviene de la vagina. °· Tiene dificultad para orinar. °· Orina con frecuencia. °· Siente dolor al orinar. °· Nota un cambio en sus movimientos intestinales. °· Aumenta el enrojecimiento, el dolor o la hinchazón en la zona de la incisión vaginal (episiotomía) o el desgarro vaginal. °· Tiene pus que drena por la episiotomía o el desgarro vaginal. °· La episiotomía o el desgarro vaginal se abren. °· Sus mamas le duelen, están duras o enrojecidas. °· Sufre un dolor intenso de cabeza. °· Tiene visión borrosa o ve manchas. °· Se siente triste o deprimida. °· Tiene pensamientos acerca de lastimarse o dañar al recién nacido. °· Tiene preguntas acerca de su cuidado personal, el cuidado del recién nacido o acerca de los medicamentos. °· Se siente mareada o sufre un desmayo. °· Tiene una erupción. °· Tiene náuseas o vómitos. °· Usted amamantó al bebé y no ha tenido su período menstrual dentro de las 12 semanas después de dejar de amamantar. °· No amamanta al bebé y no tuvo su período menstrual en las últimas 12° semanas después del   parto. °· Tiene fiebre. °SOLICITE ATENCIÓN MÉDICA DE INMEDIATO SI:  °· Siente dolor persistente. °· Siente dolor en el pecho. °· Le falta el aire. °· Se desmaya. °· Siente dolor en la pierna. °· Siente dolor en el estómago. °· El sangrado vaginal satura dos o más  apósitos en 1 hora. °  °Esta información no tiene como fin reemplazar el consejo del médico. Asegúrese de hacerle al médico cualquier pregunta que tenga. °  °Document Released: 05/15/2005 Document Revised: 02/03/2015 °Elsevier Interactive Patient Education ©2016 Elsevier Inc. ° °

## 2015-04-30 NOTE — Progress Notes (Signed)
Completed Teaching with spanish interpreter.

## 2015-04-30 NOTE — Discharge Summary (Signed)
OB Discharge Summary     Patient Name: Lisa Massey DOB: 05/23/1991 MRN: [id number]  Date of admission: 04/28/2015 Delivering MD: Berton Bon   Date of discharge: 04/30/2015  Admitting diagnosis: 40 WEEKS CTX Intrauterine pregnancy: [redacted]w[redacted]d     Secondary diagnosis:  Principal Problem:   Status post delivery at term Active Problems:   Active labor at term  Additional problems: Retained products of conception with 24 hrs of Methergine      Discharge diagnosis: Term Pregnancy Delivered                                                                                                Post partum procedures:None   Augmentation: None   Complications: None  Hospital course:  Onset of Labor With Vaginal Delivery     24 y.o. yo [id number] at [redacted]w[redacted]d was admitted in Active Laboron 04/28/2015. Patient had an uncomplicated labor course as follows: Patient with precipitous delivery. Patient with retained products of conception requiring manual extraction. Patient was placed of Methergine for 24 hours, without any further issues with bleeding and a reassuring post-partum hemoglobin.  Membrane Rupture Time/Date: 12:15 PM ,04/28/2015   Intrapartum Procedures: Episiotomy: None [1]                                         Lacerations:  None [1]  Patient had a delivery of a Viable infant. 04/28/2015  Information for the patient's newborn:  Lisa Massey, Lisa Massey [[id number]]  Delivery Method: Vaginal, Spontaneous Delivery (Filed from Delivery Summary)    Pateint had an uncomplicated postpartum course.  She is ambulating, tolerating a regular diet, passing flatus, and urinating well. Patient is discharged home in stable condition on 04/30/2015     Physical exam  Filed Vitals:   04/28/15 2011 04/29/15 0628 04/29/15 1853 04/30/15 0611  BP: 108/64 109/62 118/66 120/66  Pulse: 98 82 88 96  Temp: 98.5 F (36.9 C) 98.3 F (36.8 C) 97.7 F (36.5 C) 98 F (36.7 C)  TempSrc: Oral Oral Oral  Oral  Resp: Height:      Weight:      SpO2: 100% 100%     General: alert, cooperative and no distress Lochia: appropriate Uterine Fundus: firm Incision: N/A DVT Evaluation: No evidence of DVT seen on physical exam. Labs: Lab Results  Component Value Date   WBC 15.1* 04/29/2015   HGB 10.5* 04/29/2015   HCT 31.5* 04/29/2015   MCV 83.6 04/29/2015   PLT 196 04/29/2015   No flowsheet data found.  Discharge instruction: per After Visit Summary and "Baby and Me Booklet".  After visit meds:    Medication List    TAKE these medications        ibuprofen 600 MG tablet  Commonly known as:  ADVIL,MOTRIN  Take 1 tablet (600 mg total) by mouth every 6 (six) hours.     multivitamin-prenatal 27-0.8 MG Tabs tablet  Take 1 tablet by mouth daily at 12  noon.        Diet: routine diet  Activity: Advance as tolerated. Pelvic rest for 6 weeks.   Outpatient follow up:6 weeks Follow up Appt:No future appointments. Follow up Visit:No Follow-up on file.  Postpartum contraception: Depo Provera  Newborn Data: Live born female  Birth Weight: 8 lb 11 oz (3941 g) APGAR: 9, 9  Baby Feeding: Breast Disposition:home with mother   04/30/2015 Lisa MaiersAsiyah Z Mikell, MD   I have seen and examined this patient and agree the above assessment.  Respiratory effort normal, lochia appropriate, legs negative,  pain level normal.  Lisa Massey,Lisa Massey 04/30/2015 3:27 PM

## 2015-05-01 ENCOUNTER — Ambulatory Visit: Payer: Self-pay

## 2015-05-01 NOTE — Lactation Note (Signed)
This note was copied from the chart of Lisa Massey. Lactation Consultation Note  L[employer]istMarta Spanish Interpreter Present. Discussed supply and demand. Encouarged mother to breastfeed before giving formula to help her establish her milk supply. Reviewed engorgement care and monitoring voids/stools. Mother denies questions or problems.  Patient Name: Lisa Massey Today's Date: 05/01/2015     Maternal Data    Feeding Feeding Type: Bottle Fed - Formula  LATCH Score/Interventions                      Lactation Tools Discussed/Used     Consult Status      Dahlia ByesBerkelhammer, Therin Vetsch Boschen 05/01/2015, 11:12 AM

## 2016-09-04 ENCOUNTER — Other Ambulatory Visit (HOSPITAL_COMMUNITY): Payer: Self-pay | Admitting: *Deleted

## 2016-09-04 DIAGNOSIS — R2231 Localized swelling, mass and lump, right upper limb: Secondary | ICD-10-CM

## 2016-09-21 ENCOUNTER — Encounter (HOSPITAL_COMMUNITY): Payer: Self-pay | Admitting: *Deleted

## 2016-09-21 ENCOUNTER — Other Ambulatory Visit (HOSPITAL_COMMUNITY): Payer: Self-pay | Admitting: Obstetrics and Gynecology

## 2016-09-21 ENCOUNTER — Ambulatory Visit
Admission: RE | Admit: 2016-09-21 | Discharge: 2016-09-21 | Disposition: A | Discharge status: Home / Self Care | Payer: No Typology Code available for payment source | Source: Ambulatory Visit | Attending: Obstetrics and Gynecology | Admitting: Obstetrics and Gynecology

## 2016-09-21 ENCOUNTER — Ambulatory Visit (HOSPITAL_COMMUNITY)
Admission: RE | Admit: 2016-09-21 | Discharge: 2016-09-21 | Disposition: A | Discharge status: Home / Self Care | Payer: Self-pay | Source: Ambulatory Visit | Attending: Obstetrics and Gynecology | Admitting: Obstetrics and Gynecology

## 2016-09-21 VITALS — BP 108/74 | Temp 98.2°F | Ht 62.0 in | Wt 172.6 lb

## 2016-09-21 DIAGNOSIS — M7989 Other specified soft tissue disorders: Secondary | ICD-10-CM

## 2016-09-21 DIAGNOSIS — R2231 Localized swelling, mass and lump, right upper limb: Secondary | ICD-10-CM

## 2016-09-21 DIAGNOSIS — Z1239 Encounter for other screening for malignant neoplasm of breast: Secondary | ICD-10-CM

## 2016-09-21 NOTE — Progress Notes (Signed)
Complaints of right axillary lump x 8 months that has increased in size. Patient complained of redness and pain within the right axilla. Patient stated the pain comes and goes rating at a 9 out of 10.  Pap Smear: Pap smear not completed today. Last Pap smear was 01/04/2015 at the [facility] and normal. Per patient has no history of an abnormal Pap smear. Last Pap smear result will be scanned into EPIC.  Physical exam: Breasts Breasts symmetrical. No skin abnormalities left breast. Observed a warm reddened area within the right axilla. No nipple retraction bilateral breasts. No nipple discharge bilateral breasts. Lymphadenopathy within the right axilla. No lumps palpated bilateral breasts. No complaints of pain or tenderness on exam. Referred patient to the [facility] for a right breast ultrasound. Appointment scheduled for Thursday, September 21, 2016 at 1500.        Pelvic/Bimanual No Pap smear completed today since last Pap smear was 01/04/2015. Pap smear not indicated per BCCCP guidelines.   Smoking History: Patient has never smoked.  Patient Navigation: Patient education provided. Access to services provided for patient through [facility] program.Spanish interpreter provided.   Used Spanish interpreter Marta Col from [facility].

## 2016-09-21 NOTE — Patient Instructions (Addendum)
Explained breast self awareness with Erlinda Hong. Patient did not need a Pap smear today due to last Pap smear was 01/04/2015. Let her know BCCCP will cover Pap smears every 3 years unless has a history of abnormal Pap smears. Referred patient to the [facility] for a right breast ultrasound. Appointment scheduled for Thursday, September 21, 2016 at 1500. Erlinda Hong verbalized understanding.  Kahron Kauth, Kathaleen Maser, RN 1:31 PM

## 2016-09-22 ENCOUNTER — Encounter (HOSPITAL_COMMUNITY): Payer: Self-pay | Admitting: *Deleted

## 2018-05-29 NOTE — L&D Delivery Note (Signed)
Patient: Lisa Massey MRN: [id number]  GBS status: Negative  Patient is a 28 y.o. now G3P3003 s/p NSVD at [redacted]w[redacted]d, who was admitted for SOL. SROM 0h 56m prior to delivery with clear fluid.   Delivery Note At 8:39 AM a viable female was delivered via Vaginal, Spontaneous (Presentation: LOA).  APGAR: 8, 9; weight pending.   Placenta status: Spontaneous, Intact.  Cord: 3 vessels.  Anesthesia: Epidural Episiotomy: None Lacerations: None Suture Repair: None Est. Blood Loss (mL): 550  Head delivered LOA. No nuchal cord present. Shoulder and body delivered in usual fashion. Infant with spontaneous cry, placed on mother's abdomen, dried and bulb suctioned. Cord clamped x 2 after 1-minute delay, and cut by family member. Cord blood drawn. Placenta delivered spontaneously with gentle cord traction. Fundus firm with massage and Pitocin. Perineum inspected and found to have no lacerations.  Mom to postpartum.  Baby to Couplet care / Skin to Skin.  [name] 12/17/2018, 8:59 AM

## 2018-07-18 LAB — OB RESULTS CONSOLE HEPATITIS B SURFACE ANTIGEN: Hepatitis B Surface Ag: NEGATIVE

## 2018-07-18 LAB — OB RESULTS CONSOLE RUBELLA ANTIBODY, IGM: Rubella: IMMUNE

## 2018-07-18 LAB — OB RESULTS CONSOLE GC/CHLAMYDIA
Chlamydia: NEGATIVE
Gonorrhea: NEGATIVE

## 2018-07-18 LAB — OB RESULTS CONSOLE RPR: RPR: NONREACTIVE

## 2018-10-07 LAB — OB RESULTS CONSOLE HIV ANTIBODY (ROUTINE TESTING): HIV: NONREACTIVE

## 2018-11-19 LAB — OB RESULTS CONSOLE GBS: GBS: NEGATIVE

## 2018-12-16 ENCOUNTER — Encounter (HOSPITAL_COMMUNITY): Payer: Self-pay | Admitting: *Deleted

## 2018-12-16 ENCOUNTER — Inpatient Hospital Stay (HOSPITAL_COMMUNITY)
Admission: AD | Admit: 2018-12-16 | Discharge: 2018-12-16 | Disposition: A | Discharge status: Home / Self Care | Payer: No Typology Code available for payment source | Attending: Obstetrics & Gynecology | Admitting: Obstetrics & Gynecology

## 2018-12-16 ENCOUNTER — Other Ambulatory Visit: Payer: Self-pay

## 2018-12-16 DIAGNOSIS — O479 False labor, unspecified: Secondary | ICD-10-CM

## 2018-12-16 DIAGNOSIS — O480 Post-term pregnancy: Secondary | ICD-10-CM | POA: Insufficient documentation

## 2018-12-16 DIAGNOSIS — O26853 Spotting complicating pregnancy, third trimester: Secondary | ICD-10-CM | POA: Insufficient documentation

## 2018-12-16 DIAGNOSIS — Z3A40 40 weeks gestation of pregnancy: Secondary | ICD-10-CM | POA: Insufficient documentation

## 2018-12-16 DIAGNOSIS — O471 False labor at or after 37 completed weeks of gestation: Secondary | ICD-10-CM | POA: Insufficient documentation

## 2018-12-16 NOTE — MAU Note (Signed)
SIlva in room with pt to discuss discharge instructions,

## 2018-12-16 NOTE — Discharge Instructions (Signed)
Contracciones de Braxton Hicks °Braxton Hicks Contractions °Las contracciones del útero pueden presentarse durante todo el embarazo, pero no siempre indican que la mujer está de parto. Es posible que usted haya tenido contracciones de práctica llamadas "contracciones de Braxton Hicks". A veces, se las confunde con el parto real. °¿Qué son las contracciones de Braxton Hicks? °Las contracciones de Braxton Hicks son espasmos que se producen en los músculos del útero antes del parto. A diferencia de las contracciones del parto verdadero, estas no producen el agrandamiento (la dilatación) ni el afinamiento del cuello uterino. Hacia el final del embarazo (entre las semanas 32 y 34), las contracciones de Braxton Hicks pueden presentarse más seguido y tornarse más intensas. A veces, resulta difícil distinguirlas del parto verdadero porque pueden ser muy molestas. No debe sentirse avergonzada si concurre al hospital con falso parto. °En ocasiones, la única forma de saber si el trabajo de parto es verdadero es que el médico determine si hay cambios en el cuello del útero. El médico le hará un examen físico y quizás le controle las contracciones. Si usted no está de parto verdadero, el examen debe indicar que el cuello uterino no está dilatado y que usted no ha roto bolsa. °Si no hay otros problemas de salud asociados con su embarazo, no habrá inconvenientes si la envían a su casa con un falso parto. Es posible que las contracciones de Braxton Hicks continúen hasta que se desencadene el parto verdadero. °Cómo diferenciar el trabajo de parto falso del verdadero °Trabajo de parto verdadero °· Las contracciones duran de 30 a 70 segundos. °· Las contracciones pueden tornarse muy regulares. °· La molestia generalmente se siente en la parte superior del útero y se extiende hacia la zona baja del abdomen y hacia la cintura. °· Las contracciones no desaparecen cuando usted camina. °· Las contracciones generalmente se hacen más  intensas y aumentan en frecuencia. °· El cuello uterino se dilata y se afina. °Parto falso °· En general, las contracciones son más cortas y no tan intensas como las del parto verdadero. °· En general, las contracciones son irregulares. °· A menudo, las contracciones se sienten en la parte delantera de la parte baja del abdomen y en la ingle. °· Las contracciones pueden desaparecer cuando usted camina o cambia de posición mientras está acostada. °· Las contracciones se vuelven más débiles y su duración es menor a medida que transcurre el tiempo. °· En general, el cuello uterino no se dilata ni se afina. °Siga estas indicaciones en su casa: ° °· Tome los medicamentos de venta libre y los recetados solamente como se lo haya indicado el médico. °· Continúe haciendo los ejercicios habituales y siga las demás indicaciones que el médico le dé. °· Coma y beba con moderación si cree que está de parto. °· Si las contracciones de Braxton Hicks le provocan incomodidad: °? Cambie de posición: si está acostada o descansando, camine; si está caminando, descanse. °? Siéntese y descanse en una bañera con agua tibia. °? Beba suficiente líquido como para mantener la orina de color amarillo pálido. La deshidratación puede provocar contracciones. °? Respire lenta y profundamente varias veces por hora. °· Vaya a todas las visitas de control prenatales y de control como se lo haya indicado el médico. Esto es importante. °Comuníquese con un médico si: °· Tiene fiebre. °· Siente dolor constante en el abdomen. °Solicite ayuda de inmediato si: °· Las contracciones se intensifican, se hacen más regulares y cercanas entre sí. °· Tiene una pérdida de líquido por la vagina. °· Elimina   una mucosidad sanguinolenta (prdida del tapn mucoso).  Tiene una hemorragia vaginal.  Tiene un dolor en la zona lumbar que nunca tuvo antes.  Siente que la cabeza del beb empuja hacia abajo y ejerce presin en la zona plvica.  El beb no se mueve tanto  como antes. Resumen  Las State Farmcontracciones que se presentan antes del parto se conocen como contracciones de [place]Braxton Hicks, [place] parto o contracciones de Multimedia programmerprctica.  En general, las contracciones de [address]Braxton Hicks son ms cortas, ms dbiles, con ms tiempo entre una y [place], y menos regulares que las contracciones del parto verdadero. Las contracciones del parto verdadero se intensifican progresivamente y se tornan regulares y ms frecuentes.  Para controlar la Longs Drug Storesmolestia que producen las contracciones de [place]villeBraxton Hicks, puede cambiar de posicin, darse un bao templado y Lawyerdescansar, beber mucha agua o practicar la respiracin profunda. Esta informacin no tiene Theme park managercomo fin reemplazar el consejo del mdico. Asegrese de hacerle al mdico cualquier pregunta que tenga. Document Released: 12/25/2016 Document Revised: 08/24/2017 Document Reviewed: 12/25/2016 Elsevier Patient Education  2020 [employer]vin[employer]vier Inc.   IAC/InterActiveCorpContracciones de Braxton Hicks Braxton Hicks Contractions Las contracciones del tero pueden presentarse durante todo el [place]bar[place], [place] no siempre indican que la mujer est de [place]o. Es posible que usted haya tenido contracciones de prctica llamadas "contracciones de DarwinBraxton Hicks". A veces, se las confunde con el parto real. Qu son las contracciones de NislandBraxton Hicks? Las contracciones de Wolf PointBraxton Hicks son espasmos que se producen en los msculos del tero antes del [place] Villageparto. A diferencia de las contracciones del parto verdadero, estas no producen el agrandamiento (la dilatacin) ni el afinamiento del cuello uterino. Hacia el final del embarazo [facility](entre las semanas [phone]), las contracciones de Braxton Hicks pueden presentarse ms seguido y tornarse ms intensas. A veces, resulta difcil distinguirlas del parto verdadero porque pueden ser Murphy Oilmuy molestas. No debe sentirse avergonzada si concurre al hospital con falso parto. En ocasiones, la nica forma de saber si el trabajo de parto es verdadero es que el mdico  determine si hay cambios en el cuello del tero. El Office Depotmdico le har un examen fsico y International aid/development workerquizs le controle las contracciones. Si usted no est de Systems developerparto verdadero, el examen debe indicar que el cuello uterino no est dilatado y que usted no ha roto [place]. Si no hay otros problemas de salud asociados con su embarazo, no habr inconvenientes si la envan a su casa con un falso parto. Es posible que las contracciones de Braxton Hicks continen hasta que se desencadene el parto verdadero. Cmo diferenciar el Aleen Campitrabajo de parto falso del verdadero Trabajo de parto verdadero  Las contracciones duran de [place]30a70segundos.  Las contracciones pueden tornarse muy regulares.  La molestia generalmente se siente en la parte superior del tero y se extiende hacia la zona baja del abdomen y Parker Hannifinhacia la cintura.  Las contracciones no desaparecen cuando usted camina.  Las contracciones generalmente se hacen ms intensas y Comptrolleraumentan en frecuencia.  El cuello uterino se dilata y se afina. Parto falso  En general, las contracciones son ms cortas y no tan intensas como las del parto verdadero.  En general, las contracciones son irregulares.  A menudo, las contracciones se sienten en la parte delantera de la parte baja del abdomen y en la ingle.  Las Futures tradercontracciones pueden desaparecer cuando usted camina o [place] de posicin mientras est [place] Islandacostada.  Las contracciones se vuelven ms dbiles y su duracin es menor a medida que transcurre Allied Waste Industriesel tiempo.  En general, el cuello uterino  no se dilata ni se afina. Siga estas indicaciones en su casa:   Tome los medicamentos de venta libre y los recetados solamente como se lo haya indicado el mdico.  Contine haciendo los ejercicios habituales y siga las dems indicaciones que el mdico le d.  Coma y beba con moderacin si cree que est de parto.  Si las contracciones de [employer] provocan incomodidad: ? Cambie de posicin: si est acostada o descansando, camine;  si est caminando, descanse. ? Sintese y descanse en una baera con agua tibia. ? Beba suficiente lquido como para mantener la orina de color amarillo plido. La deshidratacin puede provocar contracciones. ? Respire lenta y profundamente varias veces por hora.  Vaya a todas las visitas de control prenatales y de control como se lo haya indicado el mdico. Esto es importante. Comunquese con un mdico si:  Tiene fiebre.  Siente dolor constante en el abdomen. Solicite ayuda de inmediato si:  Las contracciones se intensifican, se hacen ms regulares y Arboriculturist s.  Tiene una prdida de lquido por la vagina.  Elimina una mucosidad sanguinolenta (prdida del tapn mucoso).  Tiene una hemorragia vaginal.  Tiene un dolor en la zona lumbar que nunca tuvo antes.  Siente que la cabeza del beb empuja hacia abajo y ejerce presin en la zona plvica.  El beb no se mueve tanto como antes. Resumen  Las State Farm se presentan antes del parto se conocen como contracciones de [place], [place] parto o contracciones de Multimedia programmer.  En general, las contracciones de [address] son ms cortas, ms dbiles, con ms tiempo entre una y [place], y menos regulares que las contracciones del parto verdadero. Las contracciones del parto verdadero se intensifican progresivamente y se tornan regulares y ms frecuentes.  Para controlar la Longs Drug Stores producen las contracciones de [place], puede cambiar de posicin, darse un bao templado y Lawyer, beber mucha agua o practicar la respiracin profunda. Esta informacin no tiene Theme park manager el consejo del mdico. Asegrese de hacerle al mdico cualquier pregunta que tenga. Document Released: 12/25/2016 Document Revised: 08/24/2017 Document Reviewed: 12/25/2016 Elsevier Patient Education  2020 [employer].   Informacin sobre parto y [place] de parto prematuros (Preterm Labor and Birth Information) La duracin de un  embarazo normal es de 39 a 41semanas. Se llama trabajo de parto prematuro cuando se inicia antes de las 37semanas de [place]. CULES SON LOS FACTORES DE RIESGO DEL TRABAJO DE PARTO PREMATURO? Existen mayores probabilidades de trabajo de parto prematuro en mujeres con las siguientes caractersticas:  Tienen ciertas infecciones durante el embarazo, como infeccin de vejiga, infeccin de transmisin sexual o infeccin en el tero (corioamnionitis).  Tienen el cuello del tero ms corto que lo normal.  Tuvieron trabajo de parto prematuro anteriormente.  Se sometieron a una ciruga en el cuello del tero.  Son menores de 16aos o [address] de 34aos de edad.  Son afroamericanas.  Estn embarazadas de [employer] o de varios bebs (gestacin mltiple).  Consumen drogas o fuman mientras estn embarazadas.  No aumentan de peso lo suficiente durante el [employer].  Se embarazan poco despus de [employer]. CULES SON LOS SNTOMAS DEL TRABAJO DE PARTO PREMATURO? Los sntomas del trabajo de parto prematuro incluyen lo siguiente:  Educational psychologist similares a los que ocurren durante el perodo menstrual. Los calambres pueden presentarse con diarrea.  Dolor en el abdomen o en la parte inferior de la espalda.  Contracciones uterinas regulares que se pueden sentir como una presin en  el abdomen.  Una sensacin de mayor presin en la pelvis.  Aumento de la secrecin de moco acuoso o sanguinolento en la vagina.  Rotura de bolsa (rotura de saco amnitico). POR QU ES IMPORTANTE RECONOCER LOS SIGNOS DEL TRABAJO DE PARTO PREMATURO? Es Public librarianimportante reconocer los signos del trabajo de parto prematuro porque los bebs que nacen de forma prematura pueden no estar completamente desarrollados. Por lo tanto, pueden correr mayor riesgo de lo siguiente:  Problemas cardacos y pulmonares a Air cabin crewlargo plazo (crnicos).  Inmediatamente despus del parto, dificultades para regular los sistemas corporales,  que incluyen glucemia, temperatura corporal, frecuencia cardaca y frecuencia respiratoria.  Hemorragia cerebral.  Parlisis cerebral.  Dificultades en el aprendizaje.  Muerte. Estos riesgos son The Procter & Gamblemucho mayores para bebs que nacen antes de las 34semanas de [place]embarazo. CMO SE TRATA EL TRABAJO DE PARTO PREMATURO? El tratamiento depende del tiempo de su [place]barazo, su afeccin y la salud de su beb. Puede incluir lo siguiente:  Tener un punto (sutura) en el cuello del tero para evitar que este se abra demasiado pronto (cerclaje).  Tomar medicamentos, por ejemplo: ? Medicamentos hormonales. Estos se pueden administrar de forma temprana en el embarazo para ayudar a Visual merchandisermantener el embarazo. ? Medicamentos para TEFL teacherdetener las contracciones. ? Medicamentos que ayudan a McGraw-Hillmadurar los pulmones del beb. Estos se pueden recetar si el riesgo de parto es [place]alto. ? Medicamentos para evitar que el beb desarrolle parlisis cerebral. Si el trabajo de parto de inicia antes de las 34semanas de [place]barazo, es posible que deba hospitalizarse. QU DEBO HACER SI CREO QUE ESTOY EN TRABAJO DE PARTO PREMATURO? Si cree que est iniciando trabajo de parto prematuro, llame al mdico de inmediato. CMO PUEDO EVITAR EL TRABAJO DE PARTO PREMATURO EN FUTUROS EMBARAZOS? Para aumentar las probabilidades de tener un embarazo a trmino, Financial plannertenga en cuenta lo siguiente:  No consuma ningn producto que contenga tabaco, lo que incluye cigarrillos, tabaco de Theatre managermascar y Administrator, Civil Servicecigarrillos electrnicos. Si necesita ayuda para dejar de fumar, consulte al mdico.  No consuma drogas ni medicamentos que no sean recetados Academic librariandurante el embarazo.  Hable con el mdico antes de tomar suplementos a base de hierbas aunque los Reynolds Americanhaya estado tomando peridicamente.  Asegrese de llegar a un peso Office managersaludable durante el embarazo.  Tenga cuidado con las infecciones. Si cree que puede tener una infeccin, consulte al mdico para que la revisen.  Asegrese de  informarle al mdico si ha tenido trabajo de parto prematuro antes. Esta informacin no tiene Theme park managercomo fin reemplazar el consejo del mdico. Asegrese de hacerle al mdico cualquier pregunta que tenga. Document Released: 08/22/2007 Document Revised: 01/20/2016 Document Reviewed: 10/06/2015 Elsevier Patient Education  2020 ArvinMeritorElsevier Inc.   Informacin sobre parto y Crandalltrabajo de parto prematuros (Preterm Labor and Birth Information) La duracin de un embarazo normal es de 39 a 41semanas. Se llama trabajo de parto prematuro cuando se inicia antes de las 37semanas de [place] De[place]embarazo. CULES SON LOS FACTORES DE RIESGO DEL TRABAJO DE PARTO PREMATURO? Existen mayores probabilidades de trabajo de parto prematuro en mujeres con las siguientes caractersticas:  Tienen ciertas infecciones durante el embarazo, como infeccin de vejiga, infeccin de transmisin sexual o infeccin en el tero (corioamnionitis).  Tienen el cuello del tero ms corto que lo normal.  Tuvieron trabajo de parto prematuro anteriormente.  Se sometieron a una ciruga en el cuello del tero.  Son menores de 16aos o [address] de 35aos de edad.  Son afroamericanas.  Estn embarazadas de Mohawk Industriesmellizos o de varios bebs (gestacin mltiple).  Consumen drogas  o fuman mientras estn embarazadas.  No aumentan de peso lo suficiente durante el [employer].  Se embarazan poco despus de [employer]. CULES SON LOS SNTOMAS DEL TRABAJO DE PARTO PREMATURO? Los sntomas del trabajo de parto prematuro incluyen lo siguiente:  Educational psychologist similares a los que ocurren durante el perodo menstrual. Los calambres pueden presentarse con diarrea.  Dolor en el abdomen o en la parte inferior de la espalda.  Contracciones uterinas regulares que se pueden sentir como una presin en el abdomen.  Una sensacin de mayor presin en la pelvis.  Aumento de la secrecin de moco acuoso o sanguinolento en la vagina.  Rotura de bolsa (rotura de saco  amnitico). POR QU ES IMPORTANTE RECONOCER LOS SIGNOS DEL TRABAJO DE PARTO PREMATURO? Es Public librarian los signos del trabajo de parto prematuro porque los bebs que nacen de forma prematura pueden no estar completamente desarrollados. Por lo tanto, pueden correr mayor riesgo de lo siguiente:  Problemas cardacos y pulmonares a Air cabin crew (crnicos).  Inmediatamente despus del parto, dificultades para regular los sistemas corporales, que incluyen glucemia, temperatura corporal, frecuencia cardaca y frecuencia respiratoria.  Hemorragia cerebral.  Parlisis cerebral.  Dificultades en el aprendizaje.  Muerte. Estos riesgos son The Pro[employer]er [employer] Gamble para bebs que nacen antes de las 34semanas de [place]. CMO SE TRATA EL TRABAJO DE PARTO PREMATURO? El tratamiento depende del tiempo de su [place], su afeccin y la salud de su beb. Puede incluir lo siguiente:  Tener un punto (sutura) en el cuello del tero para evitar que este se abra demasiado pronto (cerclaje).  Tomar medicamentos, por ejemplo: ? Medicamentos hormonales. Estos se pueden administrar de forma temprana en el embarazo para ayudar a Visual merchandiser. ? Medicamentos para TEFL teacher las contracciones. ? Medicamentos que ayudan a [employer]raw[employer]Hill del beb. Estos se pueden recetar si el riesgo de parto es [place]. ? Medicamentos para evitar que el beb desarrolle parlisis cerebral. Si el trabajo de parto de inicia antes de las 34semanas de [place], es posible que deba hospitalizarse. QU DEBO HACER SI CREO QUE ESTOY EN TRABAJO DE PARTO PREMATURO? Si cree que est iniciando trabajo de parto prematuro, llame al mdico de inmediato. CMO PUEDO EVITAR EL TRABAJO DE PARTO PREMATURO EN FUTUROS EMBARAZOS? Para aumentar las probabilidades de tener un embarazo a trmino, Financial planner en cuenta lo siguiente:  No consuma ningn producto que contenga tabaco, lo que incluye cigarrillos, tabaco de Theatre manager y Administrator, Civil Service. Si  necesita ayuda para dejar de fumar, consulte al mdico.  No consuma drogas ni medicamentos que no sean recetados Academic librarian.  Hable con el mdico antes de tomar suplementos a base de hierbas aunque los Reynolds American.  Asegrese de llegar a un peso Office manager.  Tenga cuidado con las infecciones. Si cree que puede tener una infeccin, consulte al mdico para que la revisen.  Asegrese de informarle al mdico si ha tenido trabajo de parto prematuro antes. Esta informacin no tiene Theme park manager el consejo del mdico. Asegrese de hacerle al mdico cualquier pregunta que tenga. Document Released: 08/22/2007 Document Revised: 01/20/2016 Document Reviewed: 10/06/2015 Elsevier Patient Education  2020 [employer]vin[employer].   IAC/InterActiveCorp de Braxton Hicks Braxton Hicks Contractions Las contracciones del tero pueden presentarse durante todo el [place], [place] no siempre indican que la mujer est de [place]. Es posible que usted haya tenido contracciones de prctica llamadas "contracciones de [place]". A veces, se las confunde con el parto real. Qu son las contracciones de [place]?  Las contracciones de South Gate RidgeBraxton Hicks son espasmos que se producen en los msculos del tero antes del [place]arto. A diferencia de las contracciones del parto verdadero, estas no producen el agrandamiento (la dilatacin) ni el afinamiento del cuello uterino. Hacia el final del embarazo [facility](entre las semanas [phone]), las contracciones de Braxton Hicks pueden presentarse ms seguido y tornarse ms intensas. A veces, resulta difcil distinguirlas del parto verdadero porque pueden ser Murphy Oilmuy molestas. No debe sentirse avergonzada si concurre al hospital con falso parto. En ocasiones, la nica forma de saber si el trabajo de parto es verdadero es que el mdico determine si hay cambios en el cuello del tero. El Office Depotmdico le har un examen fsico y International aid/development workerquizs le controle las contracciones. Si  usted no est de Systems developerparto verdadero, el examen debe indicar que el cuello uterino no est dilatado y que usted no ha roto [place]. Si no hay otros problemas de salud asociados con su embarazo, no habr inconvenientes si la envan a su casa con un falso parto. Es posible que las contracciones de Braxton Hicks continen hasta que se desencadene el parto verdadero. Cmo diferenciar el Aleen Campitrabajo de parto falso del verdadero Trabajo de parto verdadero  Las contracciones duran de [id number].  Las contracciones pueden tornarse muy regulares.  La molestia generalmente se siente en la parte superior del tero y se extiende hacia la zona baja del abdomen y Parker Hannifinhacia la cintura.  Las contracciones no desaparecen cuando usted camina.  Las contracciones generalmente se hacen ms intensas y Comptrolleraumentan en frecuencia.  El cuello uterino se dilata y se afina. Parto falso  En general, las contracciones son ms cortas y no tan intensas como las del parto verdadero.  En general, las contracciones son irregulares.  A menudo, las contracciones se sienten en la parte delantera de la parte baja del abdomen y en la ingle.  Las Futures tradercontracciones pueden desaparecer cuando usted camina o [place] de posicin mientras est [place] Islandacostada.  Las contracciones se vuelven ms dbiles y su duracin es menor a medida que transcurre Allied Waste Industriesel tiempo.  En general, el cuello uterino no se dilata ni se afina. Siga estas indicaciones en su casa:   Tome los medicamentos de venta libre y los recetados solamente como se lo haya indicado el mdico.  Contine haciendo los ejercicios habituales y siga las dems indicaciones que el mdico le d.  Coma y beba con moderacin si cree que est de parto.  Si las contracciones de Dole FoodBraxton Hicks le provocan incomodidad: ? Cambie de posicin: si est acostada o descansando, camine; si est caminando, descanse. ? Sintese y descanse en una baera con agua tibia. ? Beba suficiente lquido como para mantener  la orina de color amarillo plido. La deshidratacin puede provocar contracciones. ? Respire lenta y profundamente varias veces por hora.  Vaya a todas las visitas de control prenatales y de control como se lo haya indicado el mdico. Esto es importante. Comunquese con un mdico si:  Tiene fiebre.  Siente dolor constante en el abdomen. Solicite ayuda de inmediato si:  Las contracciones se intensifican, se hacen ms regulares y Arboriculturistcercanas entre s.  Tiene una prdida de lquido por la vagina.  Elimina una mucosidad sanguinolenta (prdida del tapn mucoso).  Tiene una hemorragia vaginal.  Tiene un dolor en la zona lumbar que nunca tuvo antes.  Siente que la cabeza del beb empuja hacia abajo y ejerce presin en la zona plvica.  El beb no se mueve tanto como antes. Resumen  Las State Farmcontracciones que se  presentan antes del parto se conocen como contracciones de [place] parto o contracciones de Location manager.  En general, las contracciones de Braxton Hicks son ms cortas, ms dbiles, con ms tiempo entre una y [place], y menos regulares que las contracciones del parto verdadero. Las contracciones del parto verdadero se intensifican progresivamente y se tornan regulares y ms frecuentes.  Para controlar la [employer] producen las contracciones de [place], puede cambiar de posicin, darse un bao templado y Production assistant, radio, beber mucha agua o practicar la respiracin profunda. Esta informacin no tiene Marine scientist el consejo del mdico. Asegrese de hacerle al mdico cualquier pregunta que tenga. Document Released: 12/25/2016 Document Revised: 08/24/2017 Document Reviewed: 12/25/2016 Elsevier Patient Education  2020 [employer].

## 2018-12-16 NOTE — Progress Notes (Signed)
Dr Janus Molder notified of pt's contraction pattern, VE, and FHR tracing. Orders received to discharge pt home

## 2018-12-16 NOTE — MAU Note (Signed)
.   Lisa Massey is a 28 y.o. at [redacted]w[redacted]d here in MAU reporting: contractions that started early this morning with small amount of vaginal spotting.  Onset of complaint: today Pain score: 8 Vitals:   12/16/18 1616  BP: 134/77  Pulse: 97  Resp: 16  Temp: 98.6 F (37 C)  SpO2: 100%     FHT  155 Lab orders placed from triage: UA

## 2018-12-17 ENCOUNTER — Other Ambulatory Visit: Payer: Self-pay

## 2018-12-17 ENCOUNTER — Encounter (HOSPITAL_COMMUNITY): Payer: Self-pay | Admitting: *Deleted

## 2018-12-17 ENCOUNTER — Inpatient Hospital Stay (HOSPITAL_COMMUNITY): Payer: Medicaid Other | Admitting: Anesthesiology

## 2018-12-17 ENCOUNTER — Inpatient Hospital Stay (HOSPITAL_COMMUNITY)
Admission: AD | Admit: 2018-12-17 | Discharge: 2018-12-18 | DRG: 807 | Disposition: A | Discharge status: Home / Self Care | Payer: Medicaid Other | Attending: Family Medicine | Admitting: Family Medicine

## 2018-12-17 DIAGNOSIS — Z3A40 40 weeks gestation of pregnancy: Secondary | ICD-10-CM

## 2018-12-17 DIAGNOSIS — Z1159 Encounter for screening for other viral diseases: Secondary | ICD-10-CM | POA: Diagnosis not present

## 2018-12-17 DIAGNOSIS — Z30017 Encounter for initial prescription of implantable subdermal contraceptive: Secondary | ICD-10-CM

## 2018-12-17 DIAGNOSIS — O26893 Other specified pregnancy related conditions, third trimester: Secondary | ICD-10-CM | POA: Diagnosis present

## 2018-12-17 DIAGNOSIS — Z789 Other specified health status: Secondary | ICD-10-CM | POA: Diagnosis present

## 2018-12-17 DIAGNOSIS — Z603 Acculturation difficulty: Secondary | ICD-10-CM | POA: Diagnosis present

## 2018-12-17 LAB — CBC
HCT: 38 % (ref 36.0–46.0)
Hemoglobin: 12.4 g/dL (ref 12.0–15.0)
MCH: 27.8 pg (ref 26.0–34.0)
MCHC: 32.6 g/dL (ref 30.0–36.0)
MCV: 85.2 fL (ref 80.0–100.0)
Platelets: 209 10*3/uL (ref 150–400)
RBC: 4.46 MIL/uL (ref 3.87–5.11)
RDW: 15.3 % (ref 11.5–15.5)
WBC: 11.8 10*3/uL — ABNORMAL HIGH (ref 4.0–10.5)
nRBC: 0 % (ref 0.0–0.2)

## 2018-12-17 LAB — TYPE AND SCREEN
ABO/RH(D): B POS
Antibody Screen: NEGATIVE

## 2018-12-17 LAB — SARS CORONAVIRUS 2 BY RT PCR (HOSPITAL ORDER, PERFORMED IN ~~LOC~~ HOSPITAL LAB): SARS Coronavirus 2: NEGATIVE

## 2018-12-17 LAB — RPR: RPR Ser Ql: NONREACTIVE

## 2018-12-17 LAB — PROTEIN / CREATININE RATIO, URINE
Creatinine, Urine: 39.45 mg/dL
Protein Creatinine Ratio: 0.23 mg/mg{Cre} — ABNORMAL HIGH (ref 0.00–0.15)
Total Protein, Urine: 9 mg/dL

## 2018-12-17 LAB — ABO/RH: ABO/RH(D): B POS

## 2018-12-17 MED ORDER — FENTANYL CITRATE (PF) 100 MCG/2ML IJ SOLN
100.0000 ug | INTRAMUSCULAR | Status: DC | PRN
  Administered 2018-12-17: 100 ug via INTRAVENOUS
  Filled 2018-12-17: qty 2

## 2018-12-17 MED ORDER — OXYCODONE HCL 5 MG PO TABS: 5.0000 mg | ORAL_TABLET | ORAL | Status: DC | PRN

## 2018-12-17 MED ORDER — ONDANSETRON HCL 4 MG/2ML IJ SOLN: 4.0000 mg | INTRAMUSCULAR | Status: DC | PRN

## 2018-12-17 MED ORDER — SIMETHICONE 80 MG PO CHEW: 80.0000 mg | CHEWABLE_TABLET | ORAL | Status: DC | PRN

## 2018-12-17 MED ORDER — EPHEDRINE 5 MG/ML INJ
10.0000 mg | INTRAVENOUS | Status: DC | PRN
  Administered 2018-12-17: 03:00:00 10 mg via INTRAVENOUS

## 2018-12-17 MED ORDER — FENTANYL-BUPIVACAINE-NACL 0.5-0.125-0.9 MG/250ML-% EP SOLN: 12.0000 mL/h | EPIDURAL | Status: DC | PRN

## 2018-12-17 MED ORDER — WITCH HAZEL-GLYCERIN EX PADS: 1.0000 "application " | MEDICATED_PAD | CUTANEOUS | Status: DC | PRN

## 2018-12-17 MED ORDER — LIDOCAINE HCL (PF) 1 % IJ SOLN: 30.0000 mL | INTRAMUSCULAR | Status: DC | PRN

## 2018-12-17 MED ORDER — PHENYLEPHRINE 40 MCG/ML (10ML) SYRINGE FOR IV PUSH (FOR BLOOD PRESSURE SUPPORT): 80.0000 ug | PREFILLED_SYRINGE | INTRAVENOUS | Status: DC | PRN

## 2018-12-17 MED ORDER — MEASLES, MUMPS & RUBELLA VAC IJ SOLR: 0.5000 mL | Freq: Once | INTRAMUSCULAR | Status: DC

## 2018-12-17 MED ORDER — OXYCODONE-ACETAMINOPHEN 5-325 MG PO TABS: 1.0000 | ORAL_TABLET | ORAL | Status: DC | PRN

## 2018-12-17 MED ORDER — MISOPROSTOL 200 MCG PO TABS
600.0000 ug | ORAL_TABLET | Freq: Once | ORAL | Status: AC
  Administered 2018-12-17: 600 ug via BUCCAL

## 2018-12-17 MED ORDER — COCONUT OIL OIL: 1.0000 "application " | TOPICAL_OIL | Status: DC | PRN

## 2018-12-17 MED ORDER — SODIUM CHLORIDE (PF) 0.9 % IJ SOLN
INTRAMUSCULAR | Status: DC | PRN
  Administered 2018-12-17: 12 mL/h via EPIDURAL

## 2018-12-17 MED ORDER — PRENATAL MULTIVITAMIN CH
1.0000 | ORAL_TABLET | Freq: Every day | ORAL | Status: DC
  Administered 2018-12-17 – 2018-12-18 (×2): 1 via ORAL
  Filled 2018-12-17 (×2): qty 1

## 2018-12-17 MED ORDER — IBUPROFEN 600 MG PO TABS
600.0000 mg | ORAL_TABLET | Freq: Four times a day (QID) | ORAL | Status: DC
  Administered 2018-12-17 – 2018-12-18 (×5): 600 mg via ORAL
  Filled 2018-12-17 (×5): qty 1

## 2018-12-17 MED ORDER — SENNOSIDES-DOCUSATE SODIUM 8.6-50 MG PO TABS
2.0000 | ORAL_TABLET | ORAL | Status: DC
  Administered 2018-12-17: 2 via ORAL
  Filled 2018-12-17: qty 2

## 2018-12-17 MED ORDER — FENTANYL-BUPIVACAINE-NACL 0.5-0.125-0.9 MG/250ML-% EP SOLN
12.0000 mL/h | EPIDURAL | Status: DC | PRN
  Filled 2018-12-17: qty 250

## 2018-12-17 MED ORDER — MISOPROSTOL 200 MCG PO TABS
ORAL_TABLET | ORAL | Status: AC
  Filled 2018-12-17: qty 3

## 2018-12-17 MED ORDER — METHYLERGONOVINE MALEATE 0.2 MG/ML IJ SOLN
INTRAMUSCULAR | Status: AC
  Filled 2018-12-17: qty 1

## 2018-12-17 MED ORDER — SOD CITRATE-CITRIC ACID 500-334 MG/5ML PO SOLN: 30.0000 mL | ORAL | Status: DC | PRN

## 2018-12-17 MED ORDER — ONDANSETRON HCL 4 MG PO TABS: 4.0000 mg | ORAL_TABLET | ORAL | Status: DC | PRN

## 2018-12-17 MED ORDER — DIPHENHYDRAMINE HCL 25 MG PO CAPS: 25.0000 mg | ORAL_CAPSULE | Freq: Four times a day (QID) | ORAL | Status: DC | PRN

## 2018-12-17 MED ORDER — BENZOCAINE-MENTHOL 20-0.5 % EX AERO: 1.0000 "application " | INHALATION_SPRAY | CUTANEOUS | Status: DC | PRN

## 2018-12-17 MED ORDER — LACTATED RINGERS IV SOLN: 500.0000 mL | INTRAVENOUS | Status: DC | PRN

## 2018-12-17 MED ORDER — DIPHENHYDRAMINE HCL 50 MG/ML IJ SOLN: 12.5000 mg | INTRAMUSCULAR | Status: DC | PRN

## 2018-12-17 MED ORDER — TRANEXAMIC ACID-NACL 1000-0.7 MG/100ML-% IV SOLN
1000.0000 mg | Freq: Once | INTRAVENOUS | Status: AC
  Administered 2018-12-17: 1000 mg via INTRAVENOUS

## 2018-12-17 MED ORDER — METHYLERGONOVINE MALEATE 0.2 MG/ML IJ SOLN
0.2000 mg | Freq: Once | INTRAMUSCULAR | Status: AC
  Administered 2018-12-17: 0.2 mg via INTRAMUSCULAR

## 2018-12-17 MED ORDER — TRANEXAMIC ACID-NACL 1000-0.7 MG/100ML-% IV SOLN
INTRAVENOUS | Status: AC
  Filled 2018-12-17: qty 100

## 2018-12-17 MED ORDER — EPHEDRINE 5 MG/ML INJ
10.0000 mg | INTRAVENOUS | Status: DC | PRN
  Filled 2018-12-17: qty 10

## 2018-12-17 MED ORDER — LACTATED RINGERS IV SOLN
500.0000 mL | Freq: Once | INTRAVENOUS | Status: AC
  Administered 2018-12-17: 500 mL via INTRAVENOUS

## 2018-12-17 MED ORDER — OXYTOCIN 40 UNITS IN NORMAL SALINE INFUSION - SIMPLE MED
2.5000 [IU]/h | INTRAVENOUS | Status: DC
  Administered 2018-12-17: 2.5 [IU]/h via INTRAVENOUS
  Filled 2018-12-17: qty 1000

## 2018-12-17 MED ORDER — DIBUCAINE (PERIANAL) 1 % EX OINT: 1.0000 "application " | TOPICAL_OINTMENT | CUTANEOUS | Status: DC | PRN

## 2018-12-17 MED ORDER — ACETAMINOPHEN 325 MG PO TABS: 650.0000 mg | ORAL_TABLET | ORAL | Status: DC | PRN

## 2018-12-17 MED ORDER — ACETAMINOPHEN 325 MG PO TABS
650.0000 mg | ORAL_TABLET | ORAL | Status: DC | PRN
  Administered 2018-12-17: 650 mg via ORAL
  Filled 2018-12-17: qty 2

## 2018-12-17 MED ORDER — OXYCODONE-ACETAMINOPHEN 5-325 MG PO TABS: 2.0000 | ORAL_TABLET | ORAL | Status: DC | PRN

## 2018-12-17 MED ORDER — ZOLPIDEM TARTRATE 5 MG PO TABS: 5.0000 mg | ORAL_TABLET | Freq: Every evening | ORAL | Status: DC | PRN

## 2018-12-17 MED ORDER — OXYCODONE HCL 5 MG PO TABS: 10.0000 mg | ORAL_TABLET | ORAL | Status: DC | PRN

## 2018-12-17 MED ORDER — LIDOCAINE HCL (PF) 1 % IJ SOLN
INTRAMUSCULAR | Status: DC | PRN
  Administered 2018-12-17 (×2): 5 mL via EPIDURAL

## 2018-12-17 MED ORDER — OXYTOCIN BOLUS FROM INFUSION
500.0000 mL | Freq: Once | INTRAVENOUS | Status: AC
  Administered 2018-12-17: 500 mL via INTRAVENOUS

## 2018-12-17 MED ORDER — TETANUS-DIPHTH-ACELL PERTUSSIS 5-2.5-18.5 LF-MCG/0.5 IM SUSP: 0.5000 mL | Freq: Once | INTRAMUSCULAR | Status: DC

## 2018-12-17 MED ORDER — ONDANSETRON HCL 4 MG/2ML IJ SOLN
4.0000 mg | Freq: Four times a day (QID) | INTRAMUSCULAR | Status: DC | PRN
  Administered 2018-12-17: 11:00:00 4 mg via INTRAVENOUS
  Filled 2018-12-17: qty 2

## 2018-12-17 MED ORDER — LACTATED RINGERS IV SOLN
INTRAVENOUS | Status: DC
  Administered 2018-12-17: 01:00:00 via INTRAVENOUS

## 2018-12-17 NOTE — Discharge Instructions (Signed)
Parto vaginal, cuidados de puerperio Postpartum Care After Vaginal Delivery Lea esta informacin sobre cmo cuidarse desde el momento en que nazca su beb y hasta 6 a 12 semanas despus del parto (perodo del posparto). El mdico tambin podr darle instrucciones ms especficas. Comunquese con su mdico si tiene problemas o preguntas. Siga estas indicaciones en su casa: Hemorragia vaginal  Es normal tener un poco de hemorragia vaginal (loquios) despus del parto. Use un apsito sanitario para el sangrado vaginal y secrecin. ? Durante la primera semana despus del parto, la cantidad y el aspecto de los loquios a menudo es similar a las del perodo menstrual. ? Durante las siguientes semanas disminuir gradualmente hasta convertirse en una secrecin seca amarronada o amarillenta. ? En la mayora de las mujeres, los loquios se detienen completamente entre 4 a 6semanas despus del parto. Los sangrados vaginales pueden variar de mujer a mujer.  Cambie los apsitos sanitarios con frecuencia. Observe si hay cambios en el flujo, como: ? Un aumento repentino en el volumen. ? Cambio en el color. ? Cogulos sanguneos grandes.  Si expulsa un cogulo de sangre por la vagina, gurdelo y llame al mdico para informrselo. No deseche los cogulos de sangre por el inodoro antes de hablar con su mdico.  No use tampones ni se haga duchas vaginales hasta que el mdico la autorice.  Si no est amamantando, volver a tener su perodo entre 6 y 8 semanas despus del parto. Si solamente alimenta al beb con leche materna (lactancia materna exclusiva), podra no volver a tener su perodo hasta que deje de amamantar. Cuidados perineales  Mantenga la zona entre la vagina y el ano (perineo) limpia y seca, como se lo haya indicado el mdico. Utilice apsitos o aerosoles analgsicos y cremas, como se lo hayan indicado.  Si le hicieron un corte en el perineo (episiotoma) o tuvo un desgarro en la vagina, controle la  zona para detectar signos de infeccin hasta que sane. Est atenta a los siguientes signos: ? Aumento del enrojecimiento, la hinchazn o el dolor. ? Presenta lquido o sangre que supura del corte o desgarro. ? Calor. ? Pus o mal olor.  Es posible que le den una botella rociadora para que use en lugar de limpiarse el rea con papel higinico despus de usar el bao. Cuando comience a sanar, podr usar la botella rociadora antes de secarse. Asegrese de secarse suavemente.  Para aliviar el dolor causado por una episiotoma, un desgarro en la vagina o venas hinchadas en el ano (hemorroides), trate de tomar un bao de asiento tibio 2 o 3 veces por da. Un bao de asiento es un bao de agua tibia que se toma mientras se est sentado. El agua solo debe llegar hasta las caderas y cubrir las nalgas. Cuidado de las mamas  En los primeros das despus del parto, las mamas pueden sentirse pesadas, llenas e incmodas (congestin mamaria). Tambin puede escaparse leche de sus senos. El mdico puede sugerirle mtodos para aliviar este malestar. La congestin mamaria debera desaparecer al cabo de unos das.  Si est amamantando: ? Use un sostn que sujete y ajuste bien sus pechos. ? Mantenga los pezones secos y limpios. Aplquese cremas y ungentos, como se lo haya indicado el mdico. ? Es posible que deba usar discos de algodn en el sostn para absorber la leche que se filtre de sus senos. ? Puede tener contracciones uterinas cada vez que amamante durante varias semanas despus del parto. Las contracciones uterinas ayudan al tero a   regresar a su tamao habitual. ? Si tiene algn problema con la lactancia materna, colabore con el mdico o un asesor en lactancia.  Si no est amamantando: ? Evite tocarse mucho las mamas. Al hacerlo, podran producir ms leche. ? Use un sostn que le proporcione el ajuste correcto y compresas fras para reducir la hinchazn. ? No extraiga (saque) leche materna. Esto har que  produzca ms leche. Intimidad y sexualidad  Pregntele al mdico cundo puede retomar la actividad sexual. Esto puede depender de lo siguiente: ? Su riesgo de sufrir infecciones. ? La rapidez con la que est sanando. ? Su comodidad y deseo de retomar la actividad sexual.  Despus del parto, puede quedar embarazada incluso si no ha tenido todava su perodo. Si lo desea, hable con el mdico acerca de los mtodos de control de la natalidad (mtodos anticonceptivos). Medicamentos  Tome los medicamentos de venta libre y los recetados solamente como se lo haya indicado el mdico.  Si le recetaron un antibitico, tmelo como se lo haya indicado el mdico. No deje de tomar el antibitico aunque comience a sentirse mejor. Actividad  Retome sus actividades normales de a poco como se lo haya indicado el mdico. Pregntele al mdico qu actividades son seguras para usted.  Descanse todo lo que pueda. Trate de descansar o tomar una siesta mientras el beb duerme. Comida y bebida   Beba suficiente lquido como para mantener la orina de color amarillo plido.  Coma alimentos ricos en fibras todos los das. Estos pueden ayudarla a prevenir o aliviar el estreimiento. Los alimentos ricos en fibras incluyen, entre otros: ? Panes y cereales integrales. ? Arroz integral. ? Frijoles. ? Frutas y verduras frescas.  No intente perder de peso rpidamente reduciendo el consumo de caloras.  Tome sus vitaminas prenatales hasta la visita de seguimiento de posparto o hasta que su mdico le indique que puede dejar de tomarlas. Estilo de vida  No consuma ningn producto que contenga nicotina o tabaco, como cigarrillos y cigarrillos electrnicos. Si necesita ayuda para dejar de fumar, consulte al mdico.  No beba alcohol, especialmente si est amamantando. Instrucciones generales  Concurra a todas las visitas de seguimiento para usted y el beb, como se lo haya indicado el mdico. La mayora de las mujeres  visita al mdico para un seguimiento de posparto dentro de las primeras 3 a 6 semanas despus del parto. Comunquese con un mdico si:  Se siente incapaz de controlar los cambios que implica tener un hijo y esos sentimientos no desaparecen.  Siente tristeza o preocupacin de forma inusual.  Las mamas se ponen rojas, le duelen o se endurecen.  Tiene fiebre.  Tiene dificultad para retener la orina o para impedir que la orina se escape.  Tiene poco inters o falta de inters en actividades que solan gustarle.  No ha amamantado nada y no ha tenido un perodo menstrual durante 12 semanas despus del parto.  Dej de amamantar al beb y no ha tenido su perodo menstrual durante 12 semanas despus de dejar de amamantar.  Tiene preguntas sobre su cuidado y el del beb.  Elimina un cogulo de sangre grande por la vagina. Solicite ayuda de inmediato si:  Siente dolor en el pecho.  Tiene dificultad para respirar.  Tiene un dolor repentino e intenso en la pierna.  Tiene dolor intenso o clicos en el la parte inferior del abdomen.  Tiene una hemorragia tan intensa de la vagina que empapa ms de un apsito en una hora. El sangrado   no debe ser ms abundante que el perodo ms intenso que haya tenido.  Dolor de cabeza intenso.  Se desmaya.  Tiene visin borrosa o manchas en la vista.  Tiene secrecin vaginal con mal olor.  Tiene pensamientos acerca de lastimarse a usted misma o a su beb. Si alguna vez siente que puede lastimarse a usted misma o a otras personas, o tiene pensamientos de poner fin a su vida, busque ayuda de inmediato. Puede dirigirse al departamento de emergencias ms cercano o llamar a:  El servicio de emergencias de su localidad (911 en [place]  Una lnea de asistencia al suicida y atencin en crisis, como la Lnea Nacional de Prevencin del Suicidio (National Suicide Prevention Lifeline), al 1-800-273-8255. Est disponible las 24 horas del da. Resumen  El  perodo de tiempo justo despus el parto y hasta 6 a 12 semanas despus del parto se denomina perodo posparto.  Retome sus actividades normales de a poco como se lo haya indicado el mdico.  Concurra a todas las visitas de seguimiento para usted y el beb, como se lo haya indicado el mdico. Esta informacin no tiene como fin reemplazar el consejo del mdico. Asegrese de hacerle al mdico cualquier pregunta que tenga. Document Released: 03/12/2007 Document Revised: 08/25/2017 Document Reviewed: 05/06/2017 Elsevier Patient Education  2020 Elsevier Inc.  

## 2018-12-17 NOTE — Progress Notes (Signed)
Stratus used at 0125 to 0145. Donnamarie Poag #[id number]

## 2018-12-17 NOTE — Progress Notes (Signed)
Patient ID: Lisa Massey, female   DOB: [birth year], 28 y.o.   MRN: [id number]  CTSP for passing clots. She was given cytotec 650mcg buccally at 0903 due to transient atony/trickle. Bimanual exam expressed 604cc of clot which when added to the EBL at delivery gives a sum total of 1154cc. BP 155/74, 135/88, pulse 90s. No complaints per pt. Methergine series was started and TXA given for good resolution of bleeding. CBC will be drawn in the morning (admission hgb 12.4). Urine P/C ratio sent from cath urine due to borderline BPs, and was neg at 0.23.  Myrtis Ser CNM 12/17/2018

## 2018-12-17 NOTE — Anesthesia Procedure Notes (Signed)
Epidural Patient location during procedure: OB  Staffing Anesthesiologist: Adley Mazurowski, MD Performed: anesthesiologist   Preanesthetic Checklist Completed: patient identified, site marked, surgical consent, pre-op evaluation, timeout performed, IV checked, risks and benefits discussed and monitors and equipment checked  Epidural Patient position: sitting Prep: DuraPrep Patient monitoring: heart rate, continuous pulse ox and blood pressure Approach: right paramedian Location: L3-L4 Injection technique: LOR saline  Needle:  Needle type: Tuohy  Needle gauge: 17 G Needle length: 9 cm and 9 Needle insertion depth: 6 cm Catheter type: closed end flexible Catheter size: 20 Guage Catheter at skin depth: 10 cm Test dose: negative  Assessment Events: blood not aspirated, injection not painful, no injection resistance, negative IV test and no paresthesia  Additional Notes Patient identified. Risks/Benefits/Options discussed with patient including but not limited to bleeding, infection, nerve damage, paralysis, failed block, incomplete pain control, headache, blood pressure changes, nausea, vomiting, reactions to medication both or allergic, itching and postpartum back pain. Confirmed with bedside nurse the patient's most recent platelet count. Confirmed with patient that they are not currently taking any anticoagulation, have any bleeding history or any family history of bleeding disorders. Patient expressed understanding and wished to proceed. All questions were answered. Sterile technique was used throughout the entire procedure. Please see nursing notes for vital signs. Test dose was given through epidural needle and negative prior to continuing to dose epidural or start infusion. Warning signs of high block given to the patient including shortness of breath, tingling/numbness in hands, complete motor block, or any concerning symptoms with instructions to call for help. Patient was given  instructions on fall risk and not to get out of bed. All questions and concerns addressed with instructions to call with any issues.     

## 2018-12-17 NOTE — Progress Notes (Signed)
MOB was referred for history of depression/anxiety. * Referral screened out by Clinical Social Worker because none of the following criteria appear to apply: ~ History of anxiety/depression during this pregnancy, or of post-partum depression following prior delivery. Per Mob's H&P and further chart review, MOB denies ever having depression. ~ Diagnosis of anxiety and/or depression within last 3 years OR * MOB's symptoms currently being treated with medication and/or therapy.    Please contact the Clinical Social Worker if needs arise, by MOB request, or if MOB scores greater than 9/yes to question 10 on Edinburgh Postpartum Depression Screen.     Velera Lansdale S. Anyla Israelson, MSW, LCSW [facility] [place] [phone]  

## 2018-12-17 NOTE — MAU Note (Signed)
Pt reports to MAU c/o frequent ctx. No LOF. Small amount of vaginal bleeding. +FM.  Spanish interpreter [place].

## 2018-12-17 NOTE — Discharge Summary (Signed)
OB Discharge Summary     Patient Name: Lisa Massey DOB: 11/15/1990 MRN: [id number]  Date of admission: 12/17/2018 Delivering MD: Serita Grammes D   Date of discharge: 12/18/2018  Admitting diagnosis: 23 WKS, CTX Intrauterine pregnancy: [redacted]w[redacted]d     Secondary diagnosis:  Active Problems:   Indication for care in labor or delivery   Language barrier  Additional problems: history of LGA infant     Discharge diagnosis: Term Pregnancy Delivered and [place]                                                                                                Post partum procedures:Nexplanon insertion   Augmentation: none  Complications: [id number]  Hospital course:  Onset of Labor With Vaginal Delivery     28 y.o. yo G3P2002 at [redacted]w[redacted]d was admitted in Latent Labor on 12/17/2018. Patient had an uncomplicated, spontaneous labor course, delivering en caul as follows:  Membrane Rupture Time/Date: 8:39 AM ,12/17/2018   Intrapartum Procedures: Episiotomy: None [1]                                         Lacerations:  None [1]  Patient had a delivery of a Viable infant. 12/17/2018  Information for the patient's newborn:  [place] [[id number]]  Delivery Method: Vag-Spont    Within an hour of delivery pt was evaluated due to passing clots, 604cc, which when added to delivery EBL brings total to 1154cc. She was tx with buccal cytotec, methergine series, and TXA with resolution of bleeding. Patient went on to have an uncomplicated postpartum course.  Hgb on PPD#1 was 9.7 and was 12.4 at admission. She is ambulating, tolerating a regular diet, passing flatus, and urinating well. Patient is discharged home in stable condition on 12/18/18.   Physical exam  Vitals:   12/17/18 1649 12/17/18 2014 12/18/18 0028 12/18/18 0511  BP: 117/73 114/72 106/72 108/68  Pulse: 81 95 78 77  Resp: 17 18 17 18   Temp: 97.9 F (36.6 C) 98.2 F (36.8 C) 98.5 F (36.9 C) 97.9 F (36.6 C)  TempSrc: Oral  Oral Oral Oral  SpO2: 100% 100% 100% 100%  Weight:      Height:       General: alert, cooperative and no distress Lochia: appropriate Uterine Fundus: firm Incision: N/A DVT Evaluation: No evidence of DVT seen on physical exam. Calf/Ankle edema is present Labs: Lab Results  Component Value Date   WBC 9.3 12/18/2018   HGB 9.7 (L) 12/18/2018   HCT 29.6 (L) 12/18/2018   MCV 84.8 12/18/2018   PLT 197 12/18/2018   No flowsheet data found.  Discharge instruction: per After Visit Summary and "Baby and Me Booklet".  After visit meds:  Allergies as of 12/18/2018   No Known Allergies     Medication List    TAKE these medications   ibuprofen 600 MG tablet Commonly known as: ADVIL Take 1 tablet (600 mg total) by mouth every 6 (six) hours.  multivitamin-prenatal 27-0.8 MG Tabs tablet Take 1 tablet by mouth daily at 12 noon.       Diet: routine diet  Activity: Advance as tolerated. Pelvic rest for 6 weeks.   Outpatient follow up:4 weeks Follow up Appt:No future appointments. Follow up Visit:No follow-ups on file.  Postpartum contraception: Nexplanon placed inpatient   Newborn Data: Live born female  Birth Weight: 4110 gm (9lb 1oz) APGAR: 8, 9  Newborn Delivery   Birth date/time: 12/17/2018 08:39:00 Delivery type: Vaginal, Spontaneous      Baby Feeding: Breast Disposition:home with mother   12/18/2018 Sharyon CableVeronica C Anira [place], CNM

## 2018-12-17 NOTE — H&P (Signed)
LABOR AND DELIVERY ADMISSION HISTORY AND PHYSICAL NOTE  Lisa Massey is a 28 y.o. female G3P2002 with IUP at 2345w0d by LMP presenting for SOL.  She reports positive fetal movement. She denies leakage of fluid or vaginal bleeding.  Prenatal History/Complications: PNC at HD Pregnancy complications:  - Language barrier, Spanish interpreter needed  -h/o manual extraction of placenta  -h/o LGA baby   Past Medical History: Past Medical History:  Diagnosis Date  . Depression denies ever having depression  . Late prenatal care     Past Surgical History: Past Surgical History:  Procedure Laterality Date  . APPENDECTOMY      Obstetrical History: OB History    Gravida  3   Para  2   Term  2   Preterm      AB      Living  2     SAB      TAB      Ectopic      Multiple  0   Live Births  2           Social History: Social History   Socioeconomic History  . Marital status: Single    Spouse name: Not on file  . Number of children: Not on file  . Years of education: Not on file  . Highest education level: Not on file  Occupational History  . Not on file  Social Needs  . Financial resource strain: Not on file  . Food insecurity    Worry: Not on file    Inability: Not on file  . Transportation needs    Medical: Not on file    Non-medical: Not on file  Tobacco Use  . Smoking status: Never Smoker  . Smokeless tobacco: Never Used  Substance and Sexual Activity  . Alcohol use: No  . Drug use: No  . Sexual activity: Yes    Birth control/protection: Implant  Lifestyle  . Physical activity    Days per week: Not on file    Minutes per session: Not on file  . Stress: Not on file  Relationships  . Social Musicianconnections    Talks on phone: Not on file    Gets together: Not on file    Attends religious service: Not on file    Active member of club or organization: Not on file    Attends meetings of clubs or organizations: Not on file    Relationship status:  Not on file  Other Topics Concern  . Not on file  Social History Narrative  . Not on file    Family History: Family History  Problem Relation Age of Onset  . Hypertension Father   . Cancer Sister        ovarian    Allergies: No Known Allergies  Medications Prior to Admission  Medication Sig Dispense Refill Last Dose  . Prenatal Vit-Fe Fumarate-FA (MULTIVITAMIN-PRENATAL) 27-0.8 MG TABS tablet Take 1 tablet by mouth daily at 12 noon.   12/17/2018 at Unknown time  . ibuprofen (ADVIL,MOTRIN) 600 MG tablet Take 1 tablet (600 mg total) by mouth every 6 (six) hours. (Patient not taking: Reported on 09/21/2016) 90 tablet 0      Review of Systems  All systems reviewed and negative except as stated in HPI  Physical Exam Blood pressure (!) 146/81, pulse 93, temperature 98 F (36.7 C), temperature source Oral, last menstrual period 03/12/2018, unknown if currently breastfeeding. General appearance: alert, oriented, NAD Lungs: normal respiratory effort Heart: regular  rate Abdomen: soft, non-tender; gravid, FH appropriate for GA Extremities: No calf swelling or tenderness Presentation: cephalic Fetal monitoring: 140 bpm, moderate variability, +acels, no decels  Uterine activity: q1-5 min  Dilation: 5 Effacement (%): 50 Station: -2 Exam by:: Denyse Dago RN   Prenatal labs: ABO, Rh:  B+  Antibody:  Negative  Rubella:  Immune  RPR:   Non-reactive  HBsAg:   Negative  HIV:   Non-reactive  GC/Chlamydia: Negative  GBS:   Negative  1-hr GTT: Normal  Genetic screening:  Negative  Anatomy US: Normal   Prenatal Transfer Tool  Maternal Diabetes: No Genetic Screening: Normal Maternal Ultrasounds/Referrals: Normal Fetal Ultrasounds or other Referrals:  None Maternal Substance Abuse:  No Significant Maternal Medications:  None Significant Maternal Lab Results: Group B Strep negative  No results found for this or any previous visit (from the past 24 hour(s)).  Patient Active  Problem List   Diagnosis Date Noted  . Status post delivery at term 04/30/2015  . Active labor at term 04/28/2015    Assessment: Lisa Massey is a 28 y.o. G3P2002 at [redacted]w[redacted]d here for SOL.   #Labor: Expectant management  #Pain: Epidural upon maternal request  #FWB: Cat I  #ID:  GBS neg  #MOF: Breast and Bottle  #MOC:Undecided  #Circ:  Declines   Melina Schools 12/17/2018, 12:42 AM

## 2018-12-17 NOTE — Progress Notes (Signed)
In-house interpreter used to discuss safety and plan of care with patient.  Patient verbalizes understand and has no concerns.

## 2018-12-17 NOTE — Anesthesia Preprocedure Evaluation (Signed)

## 2018-12-17 NOTE — Lactation Note (Signed)
This note was copied from a baby's chart. Lactation Consultation Note  Patient Name: Lisa Massey [id number] Date: 12/17/2018 Reason for consult: Initial assessment;Term  7 hours old FT female who is being exclusively BF by his mother, she's a P3 and experienced BF. She was able to BF her other children for 12 months and only experienced BF difficulties with her first child. Mom has a skin tag on her left nipple and it became painful sometimes while BF her first one. She participated in the [facility] program at the [facility] and she's already familiar with hand expression.   When LC revised hand expression with mom, colostrum was easily obtained out of both breast, revised treatment for sore nipples. Noticed that mom's breast are spaced out, she reported moderate breast changes in all her pregnancies and also told LC that she always had to supplement all her babies, she'll be doing that with this one as well, that was her feeding choice on admission.  Offered assistance with latch but mom politely declined, she said baby just fed. Asked mom to call for latch assistance when needed. Reviewed normal newborn behavior, cluster feeding and feeding cues.   Feeding plan:  1. Encouraged mom to feed baby STS 8-12 times/24 hours or sooner if feeding cues are present 2. Hand expression and spoon feeding were also encouraged  BF brochure (SP), BF resources (SP) and feeding diary (SP) were reviewed. Parents reported all questions and concerns were answered, they're both aware of [facility] services and will call PRN.  Maternal Data Formula Feeding for Exclusion: Yes Reason for exclusion: Mother's choice to formula and breast feed on admission Has patient been taught Hand Expression?: Yes Does the patient have breastfeeding experience prior to this delivery?: Yes  Feeding Feeding Type: Breast Fed   Interventions Interventions: Breast feeding basics reviewed;Breast massage;Hand express;Breast compression  Lactation  Tools Discussed/Used WIC Program: Yes   Consult Status Consult Status: PRN Follow-up type: In-patient    Jahbari Repinski Francene Boyers 12/17/2018, 3:49 PM

## 2018-12-18 DIAGNOSIS — Z30017 Encounter for initial prescription of implantable subdermal contraceptive: Secondary | ICD-10-CM

## 2018-12-18 LAB — CBC
HCT: 29.6 % — ABNORMAL LOW (ref 36.0–46.0)
Hemoglobin: 9.7 g/dL — ABNORMAL LOW (ref 12.0–15.0)
MCH: 27.8 pg (ref 26.0–34.0)
MCHC: 32.8 g/dL (ref 30.0–36.0)
MCV: 84.8 fL (ref 80.0–100.0)
Platelets: 197 10*3/uL (ref 150–400)
RBC: 3.49 MIL/uL — ABNORMAL LOW (ref 3.87–5.11)
RDW: 15.4 % (ref 11.5–15.5)
WBC: 9.3 10*3/uL (ref 4.0–10.5)
nRBC: 0 % (ref 0.0–0.2)

## 2018-12-18 MED ORDER — LIDOCAINE HCL 1 % IJ SOLN
0.0000 mL | Freq: Once | INTRAMUSCULAR | Status: AC | PRN
  Administered 2018-12-18: 3 mL via INTRADERMAL
  Filled 2018-12-18: qty 20

## 2018-12-18 MED ORDER — ETONOGESTREL 68 MG ~~LOC~~ IMPL
68.0000 mg | DRUG_IMPLANT | Freq: Once | SUBCUTANEOUS | Status: AC
  Administered 2018-12-18: 68 mg via SUBCUTANEOUS
  Filled 2018-12-18: qty 1

## 2018-12-18 NOTE — Anesthesia Postprocedure Evaluation (Signed)
Anesthesia Post Note  Patient: Lisa Massey  Procedure(s) Performed: AN AD [place]     Patient location during evaluation: Mother Baby Anesthesia Type: Epidural Level of consciousness: awake and alert Pain management: pain level controlled Vital Signs Assessment: post-procedure vital signs reviewed and stable Respiratory status: spontaneous breathing, nonlabored ventilation and respiratory function stable Cardiovascular status: stable Postop Assessment: no headache, no backache, epidural receding, no apparent nausea or vomiting, patient able to bend at knees, adequate PO intake and able to ambulate Anesthetic complications: no Comments: Pt's nurse in room and translating for pt.    Last Vitals:  Vitals:   12/18/18 0028 12/18/18 0511  BP: 106/72 108/68  Pulse: 78 77  Resp: 17 18  Temp: 36.9 C 36.6 C  SpO2: 100% 100%    Last Pain:  Vitals:   12/18/18 0512  TempSrc:   PainSc: 0-No pain   Pain Goal:                   [employer]

## 2018-12-18 NOTE — Procedures (Signed)
Post-Placental Nexplanon Insertion Procedure Note  Patient was identified. Informed consent was signed, signed copy in chart. A time-out was performed.    The insertion site was identified 8-10 cm (3-4 inches) from the medial epicondyle of the humerus and 3-5 cm (1.25-2 inches) posterior to (below) the sulcus (groove) between the biceps and triceps muscles of the patient's left arm and marked. The site was prepped and draped in the usual sterile fashion. Pt was prepped with alcohol swab and then injected with 3 cc of 1% lidocaine. The site was prepped with betadine. Nexplanon removed form packaging,  Device confirmed in needle, then inserted full length of needle and withdrawn per handbook instructions. Provider and patient verified presence of the implant in the woman's arm by palpation. Pt insertion site was covered with steristrips/adhesive bandage and pressure bandage. There was minimal blood loss. Patient tolerated procedure well.  Patient was given post procedure instructions and Nexplanon user card with expiration date. Condoms were recommended for STI prevention. Patient was asked to keep the pressure dressing on for 24 hours to minimize bruising and keep the adhesive bandage on for 3-5 days. The patient verbalized understanding of the plan of care and agrees.   Lot # [id number]  Expiration Date10/28/2022  Lajean Manes, CNM 12/18/18, 11:53 AM

## 2020-07-27 ENCOUNTER — Encounter (HOSPITAL_COMMUNITY): Payer: Self-pay | Admitting: Emergency Medicine

## 2020-07-27 ENCOUNTER — Other Ambulatory Visit: Payer: Self-pay

## 2020-07-27 ENCOUNTER — Inpatient Hospital Stay (HOSPITAL_COMMUNITY)
Admission: AD | Admit: 2020-07-27 | Discharge: 2020-07-27 | Disposition: A | Discharge status: Home / Self Care | Payer: Self-pay | Attending: Obstetrics & Gynecology | Admitting: Obstetrics & Gynecology

## 2020-07-27 ENCOUNTER — Emergency Department (HOSPITAL_COMMUNITY)
Admission: EM | Admit: 2020-07-27 | Discharge: 2020-07-28 | Disposition: A | Discharge status: Home / Self Care | Payer: Self-pay | Attending: Emergency Medicine | Admitting: Emergency Medicine

## 2020-07-27 DIAGNOSIS — R1031 Right lower quadrant pain: Secondary | ICD-10-CM | POA: Insufficient documentation

## 2020-07-27 DIAGNOSIS — R1110 Vomiting, unspecified: Secondary | ICD-10-CM | POA: Insufficient documentation

## 2020-07-27 DIAGNOSIS — R300 Dysuria: Secondary | ICD-10-CM | POA: Insufficient documentation

## 2020-07-27 DIAGNOSIS — Z3202 Encounter for pregnancy test, result negative: Secondary | ICD-10-CM | POA: Insufficient documentation

## 2020-07-27 DIAGNOSIS — N939 Abnormal uterine and vaginal bleeding, unspecified: Secondary | ICD-10-CM | POA: Insufficient documentation

## 2020-07-27 LAB — BASIC METABOLIC PANEL WITH GFR
Anion gap: 9 (ref 5–15)
BUN: 7 mg/dL (ref 6–20)
CO2: 23 mmol/L (ref 22–32)
Calcium: 9.6 mg/dL (ref 8.9–10.3)
Chloride: 105 mmol/L (ref 98–111)
Creatinine, Ser: 0.62 mg/dL (ref 0.44–1.00)
GFR, Estimated: >60 mL/min
Glucose, Bld: 86 mg/dL (ref 70–99)
Potassium: 4 mmol/L (ref 3.5–5.1)
Sodium: 137 mmol/L (ref 135–145)

## 2020-07-27 LAB — URINALYSIS, ROUTINE W REFLEX MICROSCOPIC
Bilirubin Urine: NEGATIVE
Glucose, UA: NEGATIVE mg/dL
Ketones, ur: NEGATIVE mg/dL
Nitrite: NEGATIVE
Protein, ur: NEGATIVE mg/dL
Specific Gravity, Urine: 1.008 (ref 1.005–1.030)
pH: 7 (ref 5.0–8.0)

## 2020-07-27 LAB — CBC
HCT: 43.3 % (ref 36.0–46.0)
Hemoglobin: 14.2 g/dL (ref 12.0–15.0)
MCH: 27.6 pg (ref 26.0–34.0)
MCHC: 32.8 g/dL (ref 30.0–36.0)
MCV: 84.2 fL (ref 80.0–100.0)
Platelets: 311 10*3/uL (ref 150–400)
RBC: 5.14 MIL/uL — ABNORMAL HIGH (ref 3.87–5.11)
RDW: 12.8 % (ref 11.5–15.5)
WBC: 9 10*3/uL (ref 4.0–10.5)
nRBC: 0 % (ref 0.0–0.2)

## 2020-07-27 LAB — HCG, QUANTITATIVE, PREGNANCY: hCG, Beta Chain, Quant, S: <1 m[IU]/mL

## 2020-07-27 LAB — LIPASE, BLOOD: Lipase: 35 U/L (ref 11–51)

## 2020-07-27 LAB — I-STAT BETA HCG BLOOD, ED (MC, WL, AP ONLY): I-stat hCG, quantitative: <5 m[IU]/mL (ref <5)

## 2020-07-27 LAB — POCT PREGNANCY, URINE: Preg Test, Ur: NEGATIVE

## 2020-07-27 NOTE — MAU Provider Note (Signed)
   S Ms. Lisa Massey is a 30 y.o. [phone] patient who presents to MAU today with complaint of lower abdominal pain, back pain and spotting. Patient reports a positive UPT at home. UPT negative in MAU. Spanish translator used for entire visit.  O BP 138/78 (BP Location: Right Arm)   Pulse 96   Temp 98.9 F (37.2 C) (Oral)   Resp 16   Ht 5\' 3"  (1.6 m)   Wt 86.5 kg   LMP 04/28/2020   SpO2 100% Comment: room air  BMI 33.76 kg/m    Patient Vitals for the past 24 hrs:  BP Temp Temp src Pulse Resp SpO2 Height Weight  07/27/20 1727 138/78 98.9 F (37.2 C) Oral 96 16 100 % 5\' 3"  (1.6 m) 86.5 kg   Physical Exam Vitals and nursing note reviewed.  Constitutional:      General: She is not in acute distress.    Appearance: Normal appearance. She is not ill-appearing, toxic-appearing or diaphoretic.  HENT:     Head: Normocephalic and atraumatic.  Pulmonary:     Effort: Pulmonary effort is normal.  Neurological:     Mental Status: She is alert and oriented to person, place, and time.  Psychiatric:        Mood and Affect: Mood normal.        Behavior: Behavior normal.        Thought Content: Thought content normal.        Judgment: Judgment normal.    A Medical screening exam complete UPT negative hCG ordered  P Discharge from MAU in stable condition Patient given the option of transfer to [facility] for further evaluation or seek care in outpatient facility of choice List of options for follow-up given Warning signs for worsening condition that would warrant emergency follow-up discussed Patient may return to MAU as needed   Serrena Linderman, , NP 07/27/2020 6:13 PM

## 2020-07-27 NOTE — MAU Note (Signed)
Lisa Massey is a 30 y.o. here in MAU reporting: lower abdominal pain for the past 3 days. Also having spotting for the past 3 days.  LMP: 04/28/20  Onset of complaint: 3 days  Pain score: 9/10  Vitals:   07/27/20 1727  BP: 138/78  Pulse: 96  Resp: 16  Temp: 98.9 F (37.2 C)  SpO2: 100%     Lab orders placed from triage: UA, UPT

## 2020-07-27 NOTE — ED Triage Notes (Signed)
Pt reports RLQ abd pain and right flank pain  with burning dark red urination x 3 days. Pt also reports symptoms of  Nausea, chest pain and sob.

## 2020-07-28 LAB — WET PREP, GENITAL
Sperm: NONE SEEN
Trich, Wet Prep: NONE SEEN
Yeast Wet Prep HPF POC: NONE SEEN

## 2020-07-28 LAB — GC/CHLAMYDIA PROBE AMP (~~LOC~~) NOT AT ARMC
Chlamydia: NEGATIVE
Comment: NEGATIVE
Comment: NORMAL
Neisseria Gonorrhea: NEGATIVE

## 2020-07-28 MED ORDER — IBUPROFEN 400 MG PO TABS
400.0000 mg | ORAL_TABLET | Freq: Once | ORAL | Status: AC
  Administered 2020-07-28: 400 mg via ORAL
  Filled 2020-07-28: qty 1

## 2020-07-28 NOTE — ED Notes (Signed)
Pt reports increasing abdominal pain and bilateral flank pain since Friday. Pt reports she has been spotting recently and is concerned she is pregnant, however she reports two negative pregnancy tests at home and beta hCG is negative. Pt reports that she has not had her period since November and her abdomen has been increasing in size. She stated, "I feel something moving in there too," and reported that it feels similar to fetal movement that occurred during her last pregnancy. MD notified of pt's concern.

## 2020-07-28 NOTE — ED Provider Notes (Signed)
[facility] EMERGENCY DEPARTMENT Provider Note   CSN: [id number] Arrival date & time: 07/27/20  1845     History Chief Complaint  Patient presents with  . Abdominal Pain  . Flank Pain    Lisa Massey is a 30 y.o. female.  The history is provided by the patient. The history is limited by a language barrier. A language interpreter was used Michelle Nasuti [phone]).  Abdominal Pain Pain location:  RLQ Pain quality: aching   Pain radiates to:  Back Pain severity:  Moderate Onset quality:  Gradual Duration:  5 days Timing:  Constant Progression:  Worsening Chronicity:  New Relieved by:  Nothing Worsened by:  Movement Associated symptoms: dysuria, vaginal bleeding and vomiting   Associated symptoms: no cough, no diarrhea, no fever and no vaginal discharge   Flank Pain Associated symptoms include abdominal pain.   Pt reports LMP was back in November She reports concern for pregnancy with pain and vag bleeding     Past Medical History:  Diagnosis Date  . Depression denies ever having depression  . Late prenatal care     Patient Active Problem List   Diagnosis Date Noted  . Indication for care in labor or delivery 12/17/2018  . Language barrier 12/17/2018  . Status post delivery at term 04/30/2015  . Active labor at term 04/28/2015    Past Surgical History:  Procedure Laterality Date  . APPENDECTOMY       OB History    Gravida  3   Para  3   Term  3   Preterm      AB      Living  3     SAB      IAB      Ectopic      Multiple  0   Live Births  3           Family History  Problem Relation Age of Onset  . Hypertension Father   . Cancer Sister        ovarian    Social History   Tobacco Use  . Smoking status: Never Smoker  . Smokeless tobacco: Never Used  Vaping Use  . Vaping Use: Never used  Substance Use Topics  . Alcohol use: No  . Drug use: No    Home Medications Prior to Admission medications   Medication  Sig Start Date End Date Taking? Authorizing Provider  ibuprofen (ADVIL,MOTRIN) 600 MG tablet Take 1 tablet (600 mg total) by mouth every 6 (six) hours. Patient not taking: Reported on 09/21/2016 04/30/15   Berton Bon, MD  Prenatal Vit-Fe Fumarate-FA (MULTIVITAMIN-PRENATAL) 27-0.8 MG TABS tablet Take 1 tablet by mouth daily at 12 noon.    [provider]    Allergies    Patient has no known allergies.  Review of Systems   Review of Systems  Constitutional: Negative for fever.  Respiratory: Negative for cough.   Gastrointestinal: Positive for abdominal pain and vomiting. Negative for diarrhea.  Genitourinary: Positive for dysuria, flank pain and vaginal bleeding. Negative for dyspareunia and vaginal discharge.  All other systems reviewed and are negative.   Physical Exam Updated Vital Signs BP 124/81   Pulse 74   Temp 98.7 F (37.1 C) (Oral)   Resp 16   LMP 03/29/2020   SpO2 100%   Physical Exam CONSTITUTIONAL: Well developed/well nourished HEAD: Normocephalic/atraumatic EYES: EOMI/PERRL ENMT: Mucous membranes moist NECK: supple no meningeal signs SPINE/BACK:entire spine nontender CV: S1/S2 noted, no  murmurs/rubs/gallops noted LUNGS: Lungs are clear to auscultation bilaterally, no apparent distress ABDOMEN: soft, nontender, no rebound or guarding, bowel sounds noted throughout abdomen GU:no cva tenderness Pelvic exam performed with nurse [place] present.  Patient without cervical motion tenderness.  No vaginal discharge.  Small amount of vaginal bleeding.  No masses or abscess noted NEURO: Pt is awake/alert/appropriate, moves all extremitiesx4.  No facial droop.   EXTREMITIES: pulses normal/equal, full ROM SKIN: warm, color normal PSYCH: no abnormalities of mood noted, alert and oriented to situation  ED Results / Procedures / Treatments   Labs (all labs ordered are listed, but only abnormal results are displayed) Labs Reviewed  CBC - Abnormal; Notable  for the following components:      Result Value   RBC 5.14 (*)    All other components within normal limits  WET PREP, GENITAL  BASIC METABOLIC PANEL  LIPASE, BLOOD  I-STAT BETA HCG BLOOD, ED ([facility], [facility], [facility] ONLY)  GC/CHLAMYDIA PROBE AMP ([place]) NOT AT [facility]    EKG EKG Interpretation  Date/Time:  Tuesday July 27 2020 20:27:18 EST Ventricular Rate:  84 PR Interval:  154 QRS Duration: 88 QT Interval:  348 QTC Calculation: 411 R Axis:   79 Text Interpretation: Normal sinus rhythm Cannot rule out Anterior infarct , age undetermined Abnormal ECG No previous ECGs available Confirmed by Zadie Rhine ([id number]) on 07/28/2020 4:00:04 AM   Radiology No results found.  Procedures Procedures   Medications Ordered in ED Medications  ibuprofen (ADVIL) tablet 400 mg (400 mg Oral Given 07/28/20 0867)    ED Course  I have reviewed the triage vital signs and the nursing notes.  Pertinent labs results that were available during my care of the patient were reviewed by me and considered in my medical decision making (see chart for details).    MDM Rules/Calculators/A&P                          Patient presents with abdominal pain and recent vaginal bleeding.  She reports she has not had a menstrual cycle since November. Patient is not pregnant.  Her abdominal exam is unremarkable. She has had an appendectomy previously.  Low suspicion for acute abdominal emergency. Other than mild vaginal bleeding, pelvic exam was unremarkable Patient has been referred to outpatient gynecology clinic.  I utilized Spanish interpreter at bedside with patient. Patient is appropriate for discharge home Final Clinical Impression(s) / ED Diagnoses Final diagnoses:  Right lower quadrant abdominal pain  Vaginal bleeding    Rx / DC Orders ED Discharge Orders    None       Zadie Rhine, MD 07/28/20 [phone]

## 2020-10-06 ENCOUNTER — Other Ambulatory Visit: Payer: Self-pay | Admitting: Obstetrics & Gynecology

## 2020-10-06 ENCOUNTER — Other Ambulatory Visit: Payer: Self-pay

## 2020-10-06 ENCOUNTER — Encounter: Payer: Self-pay | Admitting: Obstetrics & Gynecology

## 2020-10-06 ENCOUNTER — Ambulatory Visit (INDEPENDENT_AMBULATORY_CARE_PROVIDER_SITE_OTHER): Payer: Self-pay | Admitting: Obstetrics & Gynecology

## 2020-10-06 VITALS — BP 129/77 | HR 100 | Wt 196.0 lb

## 2020-10-06 DIAGNOSIS — Z789 Other specified health status: Secondary | ICD-10-CM

## 2020-10-06 DIAGNOSIS — Z3202 Encounter for pregnancy test, result negative: Secondary | ICD-10-CM

## 2020-10-06 DIAGNOSIS — Z8041 Family history of malignant neoplasm of ovary: Secondary | ICD-10-CM

## 2020-10-06 DIAGNOSIS — R1084 Generalized abdominal pain: Secondary | ICD-10-CM

## 2020-10-06 LAB — POCT PREGNANCY, URINE: Preg Test, Ur: NEGATIVE

## 2020-10-06 MED ORDER — OMEPRAZOLE 40 MG PO CPDR
40.0000 mg | DELAYED_RELEASE_CAPSULE | Freq: Every day | ORAL | 1 refills | Status: DC
Start: 2020-10-06 — End: 2021-01-18

## 2020-10-06 NOTE — Patient Instructions (Signed)
Dolor abdominal en los adultos  Abdominal Pain, Adult  El dolor en el abdomen (dolor abdominal) puede tener muchas causas. A menudo, el dolor abdominal no es grave y mejora sin tratamiento o con tratamiento en la casa. Sin embargo, a veces el dolor abdominal es grave.  El médico le hará preguntas sobre sus antecedentes médicos y le hará un examen físico para tratar de determinar la causa del dolor abdominal.  Siga estas instrucciones en su casa:    Medicamentos  Use los medicamentos de venta libre y los recetados solamente como se lo haya indicado el médico.  No tome un laxante a menos que se lo haya indicado el médico.  Instrucciones generales  Controle su afección para detectar cualquier cambio.  Beba suficiente líquido como para mantener la orina de color amarillo pálido.  Concurra a todas las visitas de seguimiento como se lo haya indicado el médico. Esto es importante.  Comuníquese con un médico si:  El dolor abdominal cambia o empeora.  No tiene apetito o baja de peso sin proponérselo.  Está estreñido o tiene diarrea durante más de 2 o 3 días.  Tiene dolor cuando orina o defeca.  El dolor abdominal lo despierta de noche.  El dolor empeora con las comidas, después de comer o con determinados alimentos.  Tiene vómitos y no puede retener nada de lo que ingiere.  Tiene fiebre.  Observa sangre en la orina.  Solicite ayuda inmediatamente si:  El dolor no desaparece tan pronto como el médico le dijo que era esperable.  No puede dejar de vomitar.  El dolor se siente solo en zonas del abdomen, como el lado derecho o la parte inferior izquierda del abdomen. Si se localiza en la zona derecha, posiblemente podría tratarse de apendicitis.  Las heces son sanguinolentas o de color negro, o de aspecto alquitranado.  Tiene dolor intenso, cólicos o distensión abdominal.  Tiene signos de deshidratación, como los siguientes:  Orina de color oscuro, muy escasa o falta de orina.  Labios agrietados.  Sequedad de boca.  Ojos  hundidos.  Somnolencia.  Debilidad.  Tiene dificultad para respirar o dolor en el pecho.  Resumen  A menudo, el dolor abdominal no es grave y mejora sin tratamiento o con tratamiento en la casa. Sin embargo, a veces el dolor abdominal es grave.  Controle su afección para detectar cualquier cambio.  Use los medicamentos de venta libre y los recetados solamente como se lo haya indicado el médico.  Comuníquese con un médico si el dolor abdominal cambia o empeora.  Busque ayuda de inmediato si tiene dolor intenso, cólicos o distensión abdominal.  Esta información no tiene como fin reemplazar el consejo del médico. Asegúrese de hacerle al médico cualquier pregunta que tenga.  Document Revised: 11/20/2018 Document Reviewed: 11/20/2018  Elsevier Patient Education © 2021 Elsevier Inc.

## 2020-10-06 NOTE — Progress Notes (Signed)
Patient ID: Lisa Massey, female   DOB: 1990/12/19, 30 y.o.   MRN: [id number]  Chief Complaint  Patient presents with  . Follow-up  from ED visit  HPI Lisa Massey is a 30 y.o. female.  [id number] Patient's last menstrual period was 07/27/2020 (exact date). She has had irregular menses since Jan with Nexplanon in place. Nausea and vomiting started 4-5 mo ago now has LLQ and epigastric pain unrelated to menses, with reflux HPI  Past Medical History:  Diagnosis Date  . Depression denies ever having depression  . Late prenatal care     Past Surgical History:  Procedure Laterality Date  . APPENDECTOMY      Family History  Problem Relation Age of Onset  . Hypertension Father   . Cancer Sister        ovarian    Social History Social History   Tobacco Use  . Smoking status: Never Smoker  . Smokeless tobacco: Never Used  Vaping Use  . Vaping Use: Never used  Substance Use Topics  . Alcohol use: No  . Drug use: No    No Known Allergies  Current Outpatient Medications  Medication Sig Dispense Refill  . omeprazole (PRILOSEC) 40 MG capsule Take 1 capsule (40 mg total) by mouth daily. 30 capsule 1  . Prenatal Vit-Fe Fumarate-FA (MULTIVITAMIN-PRENATAL) 27-0.8 MG TABS tablet Take 1 tablet by mouth daily at 12 noon.     No current facility-administered medications for this visit.    Review of Systems Review of Systems  Constitutional: Negative.   Respiratory: Negative.   Gastrointestinal: Positive for abdominal pain, nausea and vomiting. Negative for anal bleeding and constipation.  Genitourinary: Positive for pelvic pain. Negative for vaginal bleeding and vaginal discharge.    Blood pressure 129/77, pulse 100, weight 196 lb (88.9 kg), last menstrual period 07/27/2020, unknown if currently breastfeeding.  Physical Exam Physical Exam Vitals and nursing note reviewed.  Constitutional:      Appearance: She is obese. She is not ill-appearing.  Cardiovascular:      Rate and Rhythm: Normal rate.  Pulmonary:     Effort: Pulmonary effort is normal.  Abdominal:     General: There is no distension.     Palpations: Abdomen is soft. There is no mass.     Tenderness: There is no abdominal tenderness.  Skin:    General: Skin is warm and dry.  Neurological:     Mental Status: She is alert.  Psychiatric:        Mood and Affect: Mood normal.        Behavior: Behavior normal.     Data Reviewed ED notes  Assessment Generalized abdominal pain - Plan: CBC, Comprehensive metabolic panel, Lipase, Amylase, omeprazole (PRILOSEC) 40 MG capsule, FAST [place], Ambulatory referral to Internal Medicine, [place] 125  Language barrier  Family history of ovarian cancer    Plan Review test results and have f/u with IM or other referral as indicated    Scheryl Darter 10/06/2020, 2:47 PM

## 2020-10-06 NOTE — Progress Notes (Signed)
Pt states having irregular cycles since March. Pt had Nexplanon placed in  July 2020. Pt states having abd pain and recent vaginal bleeding. Denies any vaginal bleeding today. Having only mild abd pain with nausea since March from ER visit.  Pt requesting pregnancy test today. Has not had home UPT since March.

## 2020-10-07 LAB — CBC
Hematocrit: 39.5 % (ref 34.0–46.6)
Hemoglobin: 13.6 g/dL (ref 11.1–15.9)
MCH: 28.5 pg (ref 26.6–33.0)
MCHC: 34.4 g/dL (ref 31.5–35.7)
MCV: 83 fL (ref 79–97)
Platelets: 284 10*3/uL (ref 150–450)
RBC: 4.78 x10E6/uL (ref 3.77–5.28)
RDW: 12.9 % (ref 11.7–15.4)
WBC: 7.6 10*3/uL (ref 3.4–10.8)

## 2020-10-07 LAB — COMPREHENSIVE METABOLIC PANEL
ALT: 17 IU/L (ref 0–32)
AST: 14 IU/L (ref 0–40)
Albumin/Globulin Ratio: 1.7 (ref 1.2–2.2)
Albumin: 4.2 g/dL (ref 3.9–5.0)
Alkaline Phosphatase: 83 IU/L (ref 44–121)
BUN/Creatinine Ratio: 12 (ref 9–23)
BUN: 7 mg/dL (ref 6–20)
Bilirubin Total: 0.2 mg/dL (ref 0.0–1.2)
CO2: 23 mmol/L (ref 20–29)
Calcium: 9.4 mg/dL (ref 8.7–10.2)
Chloride: 102 mmol/L (ref 96–106)
Creatinine, Ser: 0.59 mg/dL (ref 0.57–1.00)
Globulin, Total: 2.5 g/dL (ref 1.5–4.5)
Glucose: 111 mg/dL — ABNORMAL HIGH (ref 65–99)
Potassium: 3.8 mmol/L (ref 3.5–5.2)
Sodium: 138 mmol/L (ref 134–144)
Total Protein: 6.7 g/dL (ref 6.0–8.5)
eGFR: 124 mL/min/{1.73_m2} (ref >59)

## 2020-10-07 LAB — LIPASE: Lipase: 44 U/L (ref 14–72)

## 2020-10-07 LAB — AMYLASE: Amylase: 51 U/L (ref 31–110)

## 2020-10-07 LAB — CA 125: Cancer Antigen (CA) 125: 28.7 U/mL (ref 0.0–38.1)

## 2020-11-11 ENCOUNTER — Telehealth: Payer: Self-pay | Admitting: Obstetrics & Gynecology

## 2020-11-11 NOTE — Telephone Encounter (Signed)
Called pt with Spanish Interpreter Eda R., pt informed me that she was to have an U/S scheduled.  Reading provider's note from visit there was no recommendation for an U/S however a request for referral to [facility].  I advised pt that was all the provider recommended was the referral to [facility] in which then once they see her they can determine if an U/S is needed.  Pt verbalized understanding.    Leonette Nutting  11/11/20

## 2020-11-17 ENCOUNTER — Other Ambulatory Visit: Payer: Self-pay | Admitting: Nurse Practitioner

## 2020-11-18 ENCOUNTER — Other Ambulatory Visit: Payer: Self-pay | Admitting: Nurse Practitioner

## 2020-11-18 DIAGNOSIS — R140 Abdominal distension (gaseous): Secondary | ICD-10-CM

## 2020-11-18 DIAGNOSIS — R102 Pelvic and perineal pain: Secondary | ICD-10-CM

## 2020-11-24 ENCOUNTER — Other Ambulatory Visit: Payer: Self-pay

## 2020-11-24 ENCOUNTER — Other Ambulatory Visit: Payer: Self-pay | Admitting: *Deleted

## 2020-11-24 DIAGNOSIS — Z124 Encounter for screening for malignant neoplasm of cervix: Secondary | ICD-10-CM

## 2020-11-24 NOTE — Progress Notes (Signed)
Patient: Lisa Massey           Date of Birth: 07-17-1990           MRN: [id number] Visit Date: 11/24/2020 PCP: Patient, No Pcp Per (Inactive)  Cervical Cancer Screening Do you smoke?: No Have you ever had or been told you have an allergy to latex products?: No Marital status: Single Date of last pap smear: 2-5 yrs ago (10/25/2017- [facility] (Negative)) Date of last menstrual period: 03/30/19 (Implant (03/2019)) Number of pregnancies: 3 Number of births: 3 Have you ever had any of the following? Hysterectomy: No Tubal ligation (tubes tied): No Abnormal bleeding: Yes Abnormal pap smear: No Venereal warts: Yes A sex partner with venereal warts: No A high risk* sex partner: No  Cervical Exam  Abnormal Observations: Normal Exam. Cervix friable. Recommendations: Last Pap smear was 10/25/2017 at the [facility] and normal. Per patient has no history of an abnormal Pap smear. Last Pap smear result will be scanned into EPIC. Let patient know if today's Pap smear is normal that her next Pap smear will be due in 3 years. Informed patient will call her with Pap smear results within the next couple of weeks. Patient verbalized understanding.     Patient's History Patient Active Problem List   Diagnosis Date Noted   Indication for care in labor or delivery 12/17/2018   Language barrier 12/17/2018   Status post delivery at term 04/30/2015   Active labor at term 04/28/2015   Past Medical History:  Diagnosis Date   Depression denies ever having depression   Late prenatal care     Family History  Problem Relation Age of Onset   Hypertension Father    Cancer Sister        ovarian    Social History   Occupational History   Not on file  Tobacco Use   Smoking status: Never   Smokeless tobacco: Never  Vaping Use   Vaping Use: Never used  Substance and Sexual Activity   Alcohol use: No   Drug use: No   Sexual activity: Yes    Birth control/protection: Implant

## 2020-11-25 ENCOUNTER — Ambulatory Visit
Admission: RE | Admit: 2020-11-25 | Discharge: 2020-11-25 | Disposition: A | Discharge status: Home / Self Care | Payer: No Typology Code available for payment source | Source: Ambulatory Visit | Attending: Nurse Practitioner | Admitting: Nurse Practitioner

## 2020-11-25 DIAGNOSIS — R140 Abdominal distension (gaseous): Secondary | ICD-10-CM

## 2020-11-25 DIAGNOSIS — R102 Pelvic and perineal pain: Secondary | ICD-10-CM

## 2020-11-30 LAB — CYTOLOGY - PAP
Comment: NEGATIVE
Diagnosis: UNDETERMINED — AB
High risk HPV: NEGATIVE

## 2020-12-02 ENCOUNTER — Telehealth: Payer: Self-pay

## 2020-12-02 NOTE — Telephone Encounter (Signed)
Via [name], Spanish Interpreter [place]), Patient informed negative Pap results ASCUS with negative HPV, next pap due in 1 year. Patient verbalized understanding.

## 2021-01-18 ENCOUNTER — Ambulatory Visit (INDEPENDENT_AMBULATORY_CARE_PROVIDER_SITE_OTHER): Payer: Self-pay | Admitting: Obstetrics & Gynecology

## 2021-01-18 ENCOUNTER — Encounter: Payer: Self-pay | Admitting: Obstetrics & Gynecology

## 2021-01-18 ENCOUNTER — Other Ambulatory Visit: Payer: Self-pay

## 2021-01-18 VITALS — BP 120/70 | HR 87 | Resp 16 | Wt 201.0 lb

## 2021-01-18 DIAGNOSIS — N921 Excessive and frequent menstruation with irregular cycle: Secondary | ICD-10-CM

## 2021-01-18 DIAGNOSIS — Z975 Presence of (intrauterine) contraceptive device: Secondary | ICD-10-CM

## 2021-01-18 DIAGNOSIS — N898 Other specified noninflammatory disorders of vagina: Secondary | ICD-10-CM

## 2021-01-18 DIAGNOSIS — R102 Pelvic and perineal pain: Secondary | ICD-10-CM

## 2021-01-18 MED ORDER — FLUCONAZOLE 150 MG PO TABS: 150.0000 mg | ORAL_TABLET | Freq: Every day | ORAL | 1 refills | Status: AC

## 2021-01-18 NOTE — Progress Notes (Signed)
Lisa Massey 1990-10-14 [id number]   History:    30 y.o. G3P3L3 Married.  SVD x 3, youngest born in 2020.  RP:  New patient presenting for intermittent pelvic pain and  bloating  HPI: Nexplanon inserted after the birth of her youngest son in 11/2018.  Had regular menses the first year.  Since 04/2020, only 2 light menses, last one in 09/2020.  Started having intermittent colicky pelvic pain in 06/2020.  No abnormal vaginal discharge.  C/O vaginal itching today.  No UTI Sx, BMs normal.    Past medical history,surgical history, family history and social history were all reviewed and documented in the EPIC chart.  Gynecologic History No LMP recorded. Patient has had an implant.  Obstetric History OB History  Gravida Para Term Preterm AB Living  3 3 3     3   SAB IAB Ectopic Multiple Live Births        0 3    # Outcome Date GA Lbr Len/2nd Weight Sex Delivery Anes PTL Lv  3 Term 12/17/18 [redacted]w[redacted]d 15:55 / 00:17 9 lb 1 oz (4.11 kg) M Vag-Spont EPI  LIV     Birth Comments: WNL  2 Term 04/28/15 [redacted]w[redacted]d 10:20 / 00:50 8 lb 11 oz (3.941 kg) F Vag-Spont None  LIV  1 Term 05/22/12 [redacted]w[redacted]d  9 lb 4 oz (4.196 kg) M Vag-Spont EPI  LIV     ROS: A ROS was performed and pertinent positives and negatives are included in the history.  GENERAL: No fevers or chills. HEENT: No change in vision, no earache, sore throat or sinus congestion. NECK: No pain or stiffness. CARDIOVASCULAR: No chest pain or pressure. No palpitations. PULMONARY: No shortness of breath, cough or wheeze. GASTROINTESTINAL: No abdominal pain, nausea, vomiting or diarrhea, melena or bright red blood per rectum. GENITOURINARY: No urinary frequency, urgency, hesitancy or dysuria. MUSCULOSKELETAL: No joint or muscle pain, no back pain, no recent trauma. DERMATOLOGIC: No rash, no itching, no lesions. ENDOCRINE: No polyuria, polydipsia, no heat or cold intolerance. No recent change in weight. HEMATOLOGICAL: No anemia or easy bruising or bleeding.  NEUROLOGIC: No headache, seizures, numbness, tingling or weakness. PSYCHIATRIC: No depression, no loss of interest in normal activity or change in sleep pattern.     Exam:   BP 120/70   Pulse 87   Resp 16   Wt 201 lb (91.2 kg)   BMI 35.61 kg/m   Body mass index is 35.61 kg/m.  General appearance : Well developed well nourished female. No acute distress HEENT: Eyes: no retinal hemorrhage or exudates,  Neck supple, trachea midline, no carotid bruits, no thyroidmegaly Lungs: Clear to auscultation, no rhonchi or wheezes, or rib retractions  Heart: Regular rate and rhythm, no murmurs or gallops Breast:Examined in sitting and supine position were symmetrical in appearance, no palpable masses or tenderness,  no skin retraction, no nipple inversion, no nipple discharge, no skin discoloration, no axillary or supraclavicular lymphadenopathy Abdomen: no palpable masses or tenderness, no rebound or guarding Extremities: no edema or skin discoloration or tenderness  Pelvic: Vulva: Normal             Vagina: No gross lesions or discharge  Cervix: No gross lesions or discharge  Uterus  AV, normal size, shape and consistency, non-tender and mobile  Adnexa  Without masses or tenderness  Anus: Normal  Pelvic [redacted]w[redacted]d 11/25/2020:  Normal AV Uterus 12.1 x 4.5 x 5.5 cm.  No uterine mass.  Endometrial line thin at 3 mm,  no focal abnormality.  Rt ovary with a 3.2 cm simple cyst.  Lt ovary with a 4.8 cm simple cyst.  No FF in the pelvis.     Assessment/Plan:  30 y.o. female for annual exam   1. Female pelvic pain Intermittent pelvic pain with cramps and bloatedness.  Pelvic ultrasound in November 25, 2020 was normal with bilateral functional cysts.  Patient reassured.  Might be experiencing side effects from the progestin in Nexplanon.  Decision to change contraception.  Patient will follow-up to remove Nexplanon and start on NuvaRing.  2. Breakthrough bleeding on Nexplanon Breakthrough bleeding on NuvaRing and  sensation of bloating with intermittent pelvic pains.  Will remove Nexplanon at follow-up.  Counseling on contraception done thoroughly.  Patient declines Depo-Provera and IUDs.  Prefers the NuvaRing over the birth control pills.  NuvaRing usage, risks and benefits thoroughly reviewed.  Will start at follow-up after removing the Nexplanon.  3. Vagina itching Probable yeast vaginitis.  Will treat with fluconazole 1 tablet per mouth daily for 3 days.  Usage reviewed and prescription sent to pharmacy.  Other orders - etonogestrel (NEXPLANON) 68 MG IMPL implant; 1 each by Subdermal route once. - fluconazole (DIFLUCAN) 150 MG tablet; Take 1 tablet (150 mg total) by mouth daily for 3 days.   [name] MD, 4:14 PM 01/18/2021

## 2021-02-02 ENCOUNTER — Ambulatory Visit (INDEPENDENT_AMBULATORY_CARE_PROVIDER_SITE_OTHER): Payer: Self-pay | Admitting: Obstetrics & Gynecology

## 2021-02-02 ENCOUNTER — Other Ambulatory Visit: Payer: Self-pay

## 2021-02-02 ENCOUNTER — Encounter: Payer: Self-pay | Admitting: Obstetrics & Gynecology

## 2021-02-02 VITALS — BP 114/84

## 2021-02-02 DIAGNOSIS — Z3046 Encounter for surveillance of implantable subdermal contraceptive: Secondary | ICD-10-CM

## 2021-02-02 NOTE — Progress Notes (Signed)
    Lisa Massey 1991/03/23 [id number]        30 y.o.  G3P3003   RP: Nexplanon removal  HPI: On Nexplanon x after the birth of her last baby in 11/2018.  Desires removal of Nexplanon because of side effects.  Will use condoms. Intermittent colicky pelvic pains and oligomenorrhea.  Pelvic US 10/2020 wnl.   OB History  Gravida Para Term Preterm AB Living  3 3 3     3   SAB IAB Ectopic Multiple Live Births        0 3    # Outcome Date GA Lbr Len/2nd Weight Sex Delivery Anes PTL Lv  3 Term 12/17/18 [redacted]w[redacted]d 15:55 / 00:17 9 lb 1 oz (4.11 kg) M Vag-Spont EPI  LIV     Birth Comments: WNL  2 Term 04/28/15 [redacted]w[redacted]d 10:20 / 00:50 8 lb 11 oz (3.941 kg) F Vag-Spont None  LIV  1 Term 05/22/12 [redacted]w[redacted]d  9 lb 4 oz (4.196 kg) M Vag-Spont EPI  LIV    Past medical history,surgical history, problem list, medications, allergies, family history and social history were all reviewed and documented in the EPIC chart.   Directed ROS with pertinent positives and negatives documented in the history of present illness/assessment and plan.  Exam:  Vitals:   02/02/21 1327  BP: 114/84   General appearance:  Normal                                                             Nexplanon procedure note (removal)  The patient presented to the office today requesting for removal of her Nexplanon that was placed in the year 2020 on her Left arm.   On examination the nexplanon implant was palpated and the distal end  (end  closest to the elbow) was marked. The area was sterilized with Betadine solution. 1% lidocaine was used for local anesthesia and approximately 1 cc  was injected into the site that was marked where the incision was to be made. The local anesthetic was injected under the implant in an effort to keep it  close to the skin surface. Slight pressure pushing downward was made at the proximal end  of the implant in an effort to stabilize it. A bulge appeared indicating the distal end of the implant. A small  transverse incision of 2 mm was made at that location. By gently pushing the implant toward the incision, the tip became visible. Grasping the implant with a curved forcep facilitated in gently removing the implant. Full confirmation of the entire implant which is 4 cm long was inspected and was intact and was shown to the patient and discarded. After removing the implant, the incision was closed with 3Steri-Strips, a band-aid and a bandage. Patient will be instructed to remove the pressure bandage in 24 hours, the band-aid in 3 days and the Steri-Strips in 7 days.    Assessment/Plan:  30 y.o. G3P3003   1. Encounter for Nexplanon removal  Side effects on Nexplanon.  Desire to remove.  Easy removal of Nexplanon without complication.  Well-tolerated by patient.  Postprocedure precautions reviewed.  Will use condoms for contraception.  26 MD, 1:42 PM 02/02/2021

## 2021-02-04 ENCOUNTER — Encounter: Payer: Self-pay | Admitting: Obstetrics & Gynecology

## 2022-02-21 ENCOUNTER — Ambulatory Visit (INDEPENDENT_AMBULATORY_CARE_PROVIDER_SITE_OTHER): Payer: Self-pay | Admitting: Obstetrics & Gynecology

## 2022-02-21 ENCOUNTER — Other Ambulatory Visit (HOSPITAL_COMMUNITY)
Admission: RE | Admit: 2022-02-21 | Discharge: 2022-02-21 | Disposition: A | Discharge status: Home / Self Care | Payer: Self-pay | Source: Ambulatory Visit | Attending: Obstetrics & Gynecology | Admitting: Obstetrics & Gynecology

## 2022-02-21 ENCOUNTER — Encounter: Payer: Self-pay | Admitting: Obstetrics & Gynecology

## 2022-02-21 VITALS — BP 104/72 | HR 87 | Ht 61.25 in | Wt 186.0 lb

## 2022-02-21 DIAGNOSIS — Z789 Other specified health status: Secondary | ICD-10-CM

## 2022-02-21 DIAGNOSIS — R300 Dysuria: Secondary | ICD-10-CM

## 2022-02-21 DIAGNOSIS — Z01419 Encounter for gynecological examination (general) (routine) without abnormal findings: Secondary | ICD-10-CM

## 2022-02-21 NOTE — Progress Notes (Addendum)
Lisa Massey 23-Feb-1991 [id number]   History:    31 y.o. G3P3L3 Married.  SVD x 3, 31 yo, 31 yo and 31 yo.   RP:  Established patient presenting for annual gyn exam    HPI: Nexplanon removed in 01/2021.  Using condoms for contraception.  Menses regular normal every month.  No BTB.  No pelvic pain.  Pap Neg 10/2020.  No h/o abnormal Pap.  Pap reflex today.  Breasts normal.  C/O burning with urination on-off.  BMs normal.  BMI 34.86.  Needs to establish with a Fam MD.   Past medical history,surgical history, family history and social history were all reviewed and documented in the EPIC chart.  Gynecologic History No LMP recorded.  Obstetric History OB History  Gravida Para Term Preterm AB Living  3 3 3     3   SAB IAB Ectopic Multiple Live Births        0 3    # Outcome Date GA Lbr Len/2nd Weight Sex Delivery Anes PTL Lv  3 Term 12/17/18 [redacted]w[redacted]d 15:55 / 00:17 9 lb 1 oz (4.11 kg) M Vag-Spont EPI  LIV     Birth Comments: WNL  2 Term 04/28/15 [redacted]w[redacted]d 10:20 / 00:50 8 lb 11 oz (3.941 kg) F Vag-Spont None  LIV  1 Term 05/22/12 [redacted]w[redacted]d  9 lb 4 oz (4.196 kg) M Vag-Spont EPI  LIV     ROS: A ROS was performed and pertinent positives and negatives are included in the history.  GENERAL: No fevers or chills. HEENT: No change in vision, no earache, sore throat or sinus congestion. NECK: No pain or stiffness. CARDIOVASCULAR: No chest pain or pressure. No palpitations. PULMONARY: No shortness of breath, cough or wheeze. GASTROINTESTINAL: No abdominal pain, nausea, vomiting or diarrhea, melena or bright red blood per rectum. GENITOURINARY: No urinary frequency, urgency, hesitancy or dysuria. MUSCULOSKELETAL: No joint or muscle pain, no back pain, no recent trauma. DERMATOLOGIC: No rash, no itching, no lesions. ENDOCRINE: No polyuria, polydipsia, no heat or cold intolerance. No recent change in weight. HEMATOLOGICAL: No anemia or easy bruising or bleeding. NEUROLOGIC: No headache, seizures, numbness, tingling  or weakness. PSYCHIATRIC: No depression, no loss of interest in normal activity or change in sleep pattern.     Exam:   BP 104/72   Pulse 87   Ht 5' 1.25" (1.556 m)   Wt 186 lb (84.4 kg)   SpO2 99%   BMI 34.86 kg/m   Body mass index is 34.86 kg/m.  General appearance : Well developed well nourished female. No acute distress HEENT: Eyes: no retinal hemorrhage or exudates,  Neck supple, trachea midline, no carotid bruits, no thyroidmegaly Lungs: Clear to auscultation, no rhonchi or wheezes, or rib retractions  Heart: Regular rate and rhythm, no murmurs or gallops Breast:Examined in sitting and supine position were symmetrical in appearance, no palpable masses or tenderness,  no skin retraction, no nipple inversion, no nipple discharge, no skin discoloration, no axillary or supraclavicular lymphadenopathy Abdomen: no palpable masses or tenderness, no rebound or guarding Extremities: no edema or skin discoloration or tenderness  Pelvic: Vulva: Normal             Vagina: No gross lesions or discharge  Cervix: No gross lesions or discharge.  Pap reflex done.  Uterus  AV, normal size, shape and consistency, non-tender and mobile  Adnexa  Without masses or tenderness  Anus: Normal  U/A: Yellow clear, Pro Neg, Nit Neg, WBC 6-10, RBC 3-10, Bacteria  Few.  U. Culture pending.   Assessment/Plan:  31 y.o. female for annual exam   1. Encounter for routine gynecological examination with Papanicolaou smear of cervix Nexplanon removed in 01/2021.  Using condoms for contraception.  Menses regular normal every month.  No BTB.  No pelvic pain.  Pap Neg 10/2020.  No h/o abnormal Pap.  Pap reflex today.  Breasts normal.  C/O burning with urination on-off.  BMs normal.  BMI 34.86.  Needs to establish with a Fam MD. - Cytology - PAP( [place])  2. Use of condoms for contraception Declines alternative types of contraception.  3. Dysuria U/A mildly disturbed, will wait on U. Culture to decide on  Rx.  Will do reflex U. Culture. - Urinalysis,Complete w/RFL Culture   Princess Bruins MD, 2:32 PM 02/21/2022

## 2022-02-23 LAB — URINE CULTURE
MICRO NUMBER:: 13969293
SPECIMEN QUALITY:: ADEQUATE

## 2022-02-23 LAB — URINALYSIS, COMPLETE W/RFL CULTURE
Bilirubin Urine: NEGATIVE
Glucose, UA: NEGATIVE
Hyaline Cast: NONE SEEN /LPF
Ketones, ur: NEGATIVE
Nitrites, Initial: NEGATIVE
Protein, ur: NEGATIVE
Specific Gravity, Urine: 1.015 (ref 1.001–1.035)
pH: 7.5 (ref 5.0–8.0)

## 2022-02-23 LAB — CULTURE INDICATED

## 2022-02-28 LAB — CYTOLOGY - PAP
Diagnosis: NEGATIVE
Diagnosis: REACTIVE

## 2023-05-30 NOTE — L&D Delivery Note (Signed)
   Delivery Note:   [id number] at [redacted]w[redacted]d  Admitting diagnosis: DVT (deep vein thrombosis) in pregnancy [O22.30] Risks:  Patient Active Problem List   Diagnosis Date Noted   DVT (deep vein thrombosis) in pregnancy 01/12/2024   Right Deep vein thrombosis (DVT) affecting pregnancy in third trimester 12/31/2023   Pyelonephritis affecting pregnancy in third trimester 12/21/2023   Right leg DVT (HCC) 12/21/2023   History of postpartum hemorrhage, currently pregnant 07/25/2023   BMI 37.0-37.9, adult - pregravid 07/25/2023   Obesity in pregnancy 07/25/2023   Late prenatal care    Supervision of high-risk pregnancy 07/18/2023   Language barrier 12/17/2018     First Stage:  Induction of labor:IOL for R leg DVT.   Onset of labor: AROM @ 1600 Augmentation: AROM and Cytotec  ROM: AROM @ 1600 clear fluid  Active labor onset: 1600 Analgesia /Anesthesia/Pain control intrapartum: Epidural  Second Stage:  Complete dilation at 01/12/2024 1707 Onset of pushing at 1707 FHR second stage Cat II moderate variability. Decels shortly after epidural, but changing quickly.    CNM called to patient bedside. Patient Pushing in lithotomy  position with CNM and L&D staff support, FOB and spanish interpreter present for birth and supportive.   Delivery of a Live born female  Birth Weight: 7 lb 15.3 oz (3610 g) APGAR: 9, 9  Newborn Delivery   Birth date/time: 01/12/2024 17:10:00 Delivery type: Vaginal, Spontaneous     Fetal head delivered in cephalic presentation, position DOA and spontaneously restituted to ROA. Nuchal observed but infant delivered through via somersault maneuver.  Remaninig fetal body delivered with ease and vigorous crying infant placed immediately skin to skin.  Nuchal Cord: Yes    After several mins of life cord double clamped after cessation of pulsation, and cut by FOB.  Collection of cord blood for typing completed. Cord blood donation-None Arterial cord blood sample-No    Third Stage:  Hx of PPH so TXA given immediately PP. With gentle cord traction and LUS massage Placenta delivered-Spontaneous with 3 vessels. Uterine tone firm bleeding moderate. After fundal massage bleeding down to a trickle. LUS sweep performed and several small clots removed. Bleeding now less than a trickle and only with fundal massage.  Uterotonics: IV pit bolus and TXA Placenta to L&D for dispo.  Cervical laceration identified.  Episiotomy:None Local analgesia: N/A   Repair:no tears  Est. Blood Loss (mL):600 at delivery. Final QBL please see MD note for details of PPH Complications: None   Mom to postpartum.  Baby boy angel to Couplet care / Skin to Skin.  Delivery Report:   Review the Delivery Report for details.    Disney Ruggiero Erven) Emilio, MSN, CNM  [facility]  01/12/24 8:35 PM

## 2023-06-28 ENCOUNTER — Inpatient Hospital Stay (HOSPITAL_COMMUNITY): Payer: Self-pay

## 2023-06-28 ENCOUNTER — Inpatient Hospital Stay (HOSPITAL_COMMUNITY)
Admission: AD | Admit: 2023-06-28 | Discharge: 2023-06-28 | Disposition: A | Discharge status: Home / Self Care | Payer: Self-pay | Attending: Certified Nurse Midwife | Admitting: Certified Nurse Midwife

## 2023-06-28 ENCOUNTER — Other Ambulatory Visit: Payer: Self-pay

## 2023-06-28 DIAGNOSIS — O26891 Other specified pregnancy related conditions, first trimester: Secondary | ICD-10-CM | POA: Insufficient documentation

## 2023-06-28 DIAGNOSIS — Z3A10 10 weeks gestation of pregnancy: Secondary | ICD-10-CM | POA: Insufficient documentation

## 2023-06-28 DIAGNOSIS — O209 Hemorrhage in early pregnancy, unspecified: Secondary | ICD-10-CM | POA: Insufficient documentation

## 2023-06-28 DIAGNOSIS — R1031 Right lower quadrant pain: Secondary | ICD-10-CM | POA: Insufficient documentation

## 2023-06-28 DIAGNOSIS — N898 Other specified noninflammatory disorders of vagina: Secondary | ICD-10-CM | POA: Insufficient documentation

## 2023-06-28 LAB — COMPREHENSIVE METABOLIC PANEL
ALT: 12 U/L (ref 0–44)
AST: 17 U/L (ref 15–41)
Albumin: 3.5 g/dL (ref 3.5–5.0)
Alkaline Phosphatase: 44 U/L (ref 38–126)
Anion gap: 9 (ref 5–15)
BUN: <5 mg/dL — ABNORMAL LOW (ref 6–20)
CO2: 24 mmol/L (ref 22–32)
Calcium: 9.4 mg/dL (ref 8.9–10.3)
Chloride: 104 mmol/L (ref 98–111)
Creatinine, Ser: 0.54 mg/dL (ref 0.44–1.00)
GFR, Estimated: >60 mL/min (ref >60)
Glucose, Bld: 93 mg/dL (ref 70–99)
Potassium: 3.7 mmol/L (ref 3.5–5.1)
Sodium: 137 mmol/L (ref 135–145)
Total Bilirubin: 0.4 mg/dL (ref 0.0–1.2)
Total Protein: 6.7 g/dL (ref 6.5–8.1)

## 2023-06-28 LAB — URINALYSIS, ROUTINE W REFLEX MICROSCOPIC
Bilirubin Urine: NEGATIVE
Glucose, UA: NEGATIVE mg/dL
Ketones, ur: NEGATIVE mg/dL
Nitrite: NEGATIVE
Protein, ur: NEGATIVE mg/dL
Specific Gravity, Urine: 1.005 (ref 1.005–1.030)
pH: 8 (ref 5.0–8.0)

## 2023-06-28 LAB — CBC WITH DIFFERENTIAL/PLATELET
Abs Immature Granulocytes: 0.02 10*3/uL (ref 0.00–0.07)
Basophils Absolute: 0 10*3/uL (ref 0.0–0.1)
Basophils Relative: 1 %
Eosinophils Absolute: 0.1 10*3/uL (ref 0.0–0.5)
Eosinophils Relative: 1 %
HCT: 36.6 % (ref 36.0–46.0)
Hemoglobin: 12.5 g/dL (ref 12.0–15.0)
Immature Granulocytes: 0 %
Lymphocytes Relative: 24 %
Lymphs Abs: 2 10*3/uL (ref 0.7–4.0)
MCH: 28.5 pg (ref 26.0–34.0)
MCHC: 34.2 g/dL (ref 30.0–36.0)
MCV: 83.4 fL (ref 80.0–100.0)
Monocytes Absolute: 0.5 10*3/uL (ref 0.1–1.0)
Monocytes Relative: 6 %
Neutro Abs: 6 10*3/uL (ref 1.7–7.7)
Neutrophils Relative %: 68 %
Platelets: 244 10*3/uL (ref 150–400)
RBC: 4.39 MIL/uL (ref 3.87–5.11)
RDW: 12.3 % (ref 11.5–15.5)
WBC: 8.6 10*3/uL (ref 4.0–10.5)
nRBC: 0 % (ref 0.0–0.2)

## 2023-06-28 LAB — POCT PREGNANCY, URINE: Preg Test, Ur: POSITIVE — AB

## 2023-06-28 LAB — WET PREP, GENITAL
Clue Cells Wet Prep HPF POC: NONE SEEN
Sperm: NONE SEEN
Trich, Wet Prep: NONE SEEN
WBC, Wet Prep HPF POC: >=10 — AB (ref <10)
Yeast Wet Prep HPF POC: NONE SEEN

## 2023-06-28 LAB — HCG, QUANTITATIVE, PREGNANCY: hCG, Beta Chain, Quant, S: 55096 m[IU]/mL — ABNORMAL HIGH (ref <5)

## 2023-06-28 LAB — ABO/RH: ABO/RH(D): B POS

## 2023-06-28 MED ORDER — NYSTATIN-TRIAMCINOLONE 100000-0.1 UNIT/GM-% EX CREA: TOPICAL_CREAM | CUTANEOUS | 0 refills | Status: DC

## 2023-06-28 MED ORDER — ACETAMINOPHEN 500 MG PO TABS
1000.0000 mg | ORAL_TABLET | Freq: Once | ORAL | Status: AC
  Administered 2023-06-28: 1000 mg via ORAL
  Filled 2023-06-28: qty 2

## 2023-06-28 MED ORDER — NYSTATIN-TRIAMCINOLONE 100000-0.1 UNIT/GM-% EX OINT: 1.0000 | TOPICAL_OINTMENT | Freq: Two times a day (BID) | CUTANEOUS | 0 refills | Status: DC

## 2023-06-28 MED ORDER — NYSTATIN-TRIAMCINOLONE 100000-0.1 UNIT/GM-% EX OINT: 1.0000 | TOPICAL_OINTMENT | Freq: Two times a day (BID) | CUTANEOUS | Status: DC

## 2023-06-28 NOTE — MAU Provider Note (Signed)
History   Lisa Massey , a  33 y.o. [phone] at Unknown presents to MAU for concerns of VB and passing clots. Patient reports this as a new occurrence which presented today at ~1pm. She reports she noticed it when she wiped only (patient has a photo on her phone). She reports no other episodes since. She also reports RLQ abdominal cramping. She reports the pain as an 8/10 and with the its first occurrence on Monday and reoccurred today when the new sx's presented. She has not taken anything for the pain. She also reports sx's of dysuria and yellowish vaginal discharge but denies odor. She states she had a +HPT which was confirmed outpatient at a clinic. Patient reports she has been trying to schedule an appointment with the clinic but has been told the schedule is not yet available, for February, when she calls.    RN note: Lisa Massey is a 33 y.o. at Unknown here in MAU reporting: she's having VB and passing blood clots that began today @ noon.  Reports VB is noted when wiping.  Reports also having pain in right lower abdomen, states pain is constant and burning.    RN shown photo of VB, bleeding light with few small stringy clots   LMP: 03/18/2023 Onset of complaint: today Pain score: 8     CSN: [id number]  Arrival date and time: 06/28/23 1649   Event Date/Time   First Provider Initiated Contact with Patient 06/28/23 1914      Chief Complaint  Patient presents with   Vaginal Bleeding    She's having VB    Abdominal Pain   Vaginal Bleeding The patient's primary symptoms include vaginal bleeding and vaginal discharge. This is a new problem. The current episode started today. The problem has been resolved. Associated symptoms include abdominal pain, dysuria and nausea. Pertinent negatives include no constipation, diarrhea, fever, flank pain or vomiting. The vaginal discharge was yellow. The vaginal bleeding is lighter than menses. She has been passing clots. She has tried nothing  for the symptoms.  Abdominal Pain This is a recurrent problem. The current episode started in the past 7 days. The problem occurs intermittently. The problem has been unchanged. The pain is located in the RLQ. The pain is at a severity of 8/10. The pain is moderate. The quality of the pain is cramping. The abdominal pain does not radiate. Associated symptoms include dysuria and nausea. Pertinent negatives include no constipation, diarrhea, fever or vomiting. She has tried nothing for the symptoms. Prior diagnostic workup includes ultrasound.    OB History     Gravida  3   Para  3   Term  3   Preterm      AB      Living  3      SAB      IAB      Ectopic      Multiple  0   Live Births  3           Past Medical History:  Diagnosis Date   Depression denies ever having depression   Late prenatal care     Past Surgical History:  Procedure Laterality Date   APPENDECTOMY     nexplanon      inserted 11/2018 per patient    Family History  Problem Relation Age of Onset   Hypertension Father    Cancer Sister        ovarian    Social History  Tobacco Use   Smoking status: Never   Smokeless tobacco: Never  Vaping Use   Vaping status: Never Used  Substance Use Topics   Alcohol use: No   Drug use: No    Allergies: No Known Allergies  No medications prior to admission.    Review of Systems  Constitutional:  Positive for diaphoresis. Negative for fever.  Gastrointestinal:  Positive for abdominal pain and nausea. Negative for constipation, diarrhea and vomiting.  Genitourinary:  Positive for dysuria, vaginal bleeding and vaginal discharge. Negative for flank pain.   Physical Exam   Blood pressure 128/72, pulse 80, temperature 98.2 F (36.8 C), temperature source Oral, resp. rate 20, weight 87.4 kg, last menstrual period 03/18/2023, SpO2 100%.  Physical Exam Constitutional:      General: She is not in acute distress.    Appearance: She is not  ill-appearing.  Cardiovascular:     Rate and Rhythm: Normal rate.  Pulmonary:     Effort: Pulmonary effort is normal.  Abdominal:     Palpations: Abdomen is soft.     Tenderness: There is abdominal tenderness in the right lower quadrant. There is no right CVA tenderness or left CVA tenderness.  Skin:    General: Skin is warm and dry.  Neurological:     Mental Status: She is alert and oriented to person, place, and time.  Psychiatric:        Mood and Affect: Mood normal.        Behavior: Behavior normal.     MAU Course  Procedures Orders Placed This Encounter  Procedures   Culture, OB Urine   Wet prep, genital   US OB LESS THAN 14 WEEKS WITH OB TRANSVAGINAL   Urinalysis, Routine w reflex microscopic -Urine, Clean Catch   CBC with Differential/Platelet   Comprehensive metabolic panel   Pregnancy, urine POC   ABO/Rh    Results for orders placed or performed during the hospital encounter of 06/28/23 (from the past 24 hours)  Urinalysis, Routine w reflex microscopic -Urine, Clean Catch     Status: Abnormal   Collection Time: 06/28/23  5:53 PM  Result Value Ref Range   Color, Urine YELLOW YELLOW   APPearance CLEAR CLEAR   Specific Gravity, Urine 1.005 1.005 - 1.030   pH 8.0 5.0 - 8.0   Glucose, UA NEGATIVE NEGATIVE mg/dL   Hgb urine dipstick LARGE (A) NEGATIVE   Bilirubin Urine NEGATIVE NEGATIVE   Ketones, ur NEGATIVE NEGATIVE mg/dL   Protein, ur NEGATIVE NEGATIVE mg/dL   Nitrite NEGATIVE NEGATIVE   Leukocytes,Ua TRACE (A) NEGATIVE   RBC / HPF 6-10 0 - 5 RBC/hpf   WBC, UA 0-5 0 - 5 WBC/hpf   Bacteria, UA RARE (A) NONE SEEN   Squamous Epithelial / HPF 6-10 0 - 5 /HPF  Pregnancy, urine POC     Status: Abnormal   Collection Time: 06/28/23  5:54 PM  Result Value Ref Range   Preg Test, Ur POSITIVE (A) NEGATIVE  CBC with Differential/Platelet     Status: None   Collection Time: 06/28/23  6:09 PM  Result Value Ref Range   WBC 8.6 4.0 - 10.5 K/uL   RBC 4.39 3.87 - 5.11  MIL/uL   Hemoglobin 12.5 12.0 - 15.0 g/dL   HCT 81.1 91.4 - 78.2 %   MCV 83.4 80.0 - 100.0 fL   MCH 28.5 26.0 - 34.0 pg   MCHC 34.2 30.0 - 36.0 g/dL   RDW 95.6 21.3 - 08.6 %  Platelets 244 150 - 400 K/uL   nRBC 0.0 0.0 - 0.2 %   Neutrophils Relative % 68 %   Neutro Abs 6.0 1.7 - 7.7 K/uL   Lymphocytes Relative 24 %   Lymphs Abs 2.0 0.7 - 4.0 K/uL   Monocytes Relative 6 %   Monocytes Absolute 0.5 0.1 - 1.0 K/uL   Eosinophils Relative 1 %   Eosinophils Absolute 0.1 0.0 - 0.5 K/uL   Basophils Relative 1 %   Basophils Absolute 0.0 0.0 - 0.1 K/uL   Immature Granulocytes 0 %   Abs Immature Granulocytes 0.02 0.00 - 0.07 K/uL  Comprehensive metabolic panel     Status: Abnormal   Collection Time: 06/28/23  6:09 PM  Result Value Ref Range   Sodium 137 135 - 145 mmol/L   Potassium 3.7 3.5 - 5.1 mmol/L   Chloride 104 98 - 111 mmol/L   CO2 24 22 - 32 mmol/L   Glucose, Bld 93 70 - 99 mg/dL   BUN <5 (L) 6 - 20 mg/dL   Creatinine, Ser 1.61 0.44 - 1.00 mg/dL   Calcium 9.4 8.9 - 09.6 mg/dL   Total Protein 6.7 6.5 - 8.1 g/dL   Albumin 3.5 3.5 - 5.0 g/dL   AST 17 15 - 41 U/L   ALT 12 0 - 44 U/L   Alkaline Phosphatase 44 38 - 126 U/L   Total Bilirubin 0.4 0.0 - 1.2 mg/dL   GFR, Estimated >04 >54 mL/min   Anion gap 9 5 - 15  Wet prep, genital     Status: Abnormal   Collection Time: 06/28/23  6:09 PM   Specimen: PATH Cytology Cervicovaginal Ancillary Only  Result Value Ref Range   Yeast Wet Prep HPF POC NONE SEEN NONE SEEN   Trich, Wet Prep NONE SEEN NONE SEEN   Clue Cells Wet Prep HPF POC NONE SEEN NONE SEEN   WBC, Wet Prep HPF POC >=10 (A) <10   Sperm NONE SEEN   ABO/Rh     Status: None   Collection Time: 06/28/23  6:09 PM  Result Value Ref Range   ABO/RH(D)      B POS Performed at [facility], [address] [place], [place] [address]      MDM  Vaginal bleeding, in early pregnancy Abdominal pain, in early pregnancy Ectopic pregnancy CBCD/CMP/ABORh: unremarkable. Rh  positive TVUS: Single live IUP; EGA [redacted]w[redacted]d based on CRL hCGQ: 55,096 Vaginal infection, unspecified Wet prep: wnl GC/CT collected Bladder/kidney infection, unspecified UA: unremarkable Ucx collected Reported dysuria. Labs not suggestive of any acute issues at this time. Plan to treat vaginal irritation.    Assessment and Plan  [redacted] weeks gestation of pregnancy Vaginal bleeding in early pregnancy Abdominal pain in early pregnancy -Recommend follow-up in the office -Message sent to [facility] to establish new OB [facility] -Reassurance given. Questions encouraged and answered  4. Vaginal irritation -Rx for Nystatin oint -Advised patient to contact provider if sx's do not improve or worsen  Return precautions given and reviewed  Due to language barrier, an interpreter was present during the history-taking and subsequent discussion (and for part of the physical exam) with this patient.   Darrell Jewel, SNM 06/28/2023, 7:22 PM

## 2023-06-28 NOTE — MAU Note (Signed)
Lisa Massey is a 33 y.o. at Unknown here in MAU reporting: she's having VB and passing blood clots that began today @ noon.  Reports VB is noted when wiping.  Reports also having pain in right lower abdomen, states pain is constant and burning.   RN shown photo of VB, bleeding light with few small stringy clots  LMP: 03/18/2023 Onset of complaint: today Pain score: 8 Vitals:   06/28/23 1739  BP: 128/72  Pulse: 80  Resp: 20  Temp: 98.2 F (36.8 C)  SpO2: 100%     FHT: attempted, none heard  Lab orders placed from triage: UPT

## 2023-06-29 LAB — GC/CHLAMYDIA PROBE AMP (~~LOC~~) NOT AT ARMC
Chlamydia: NEGATIVE
Comment: NEGATIVE
Comment: NORMAL
Neisseria Gonorrhea: NEGATIVE

## 2023-06-30 LAB — CULTURE, OB URINE
Culture: 40000 — AB
Special Requests: NORMAL

## 2023-07-03 ENCOUNTER — Other Ambulatory Visit: Payer: Self-pay | Admitting: Obstetrics and Gynecology

## 2023-07-03 DIAGNOSIS — N3000 Acute cystitis without hematuria: Secondary | ICD-10-CM

## 2023-07-03 MED ORDER — CEPHALEXIN 500 MG PO CAPS: 500.0000 mg | ORAL_CAPSULE | Freq: Three times a day (TID) | ORAL | 0 refills | Status: AC

## 2023-07-05 ENCOUNTER — Telehealth: Payer: Self-pay

## 2023-07-05 NOTE — Telephone Encounter (Signed)
 Called Pt using [employer] id# [id number] to go over Pt's +UTI test results & advise of Keflex  sent to pharmacy on file.Pt verbalized understanding.

## 2023-07-18 ENCOUNTER — Other Ambulatory Visit: Payer: Self-pay

## 2023-07-18 ENCOUNTER — Ambulatory Visit (INDEPENDENT_AMBULATORY_CARE_PROVIDER_SITE_OTHER): Payer: Self-pay

## 2023-07-18 VITALS — Wt 188.2 lb

## 2023-07-18 DIAGNOSIS — Z3480 Encounter for supervision of other normal pregnancy, unspecified trimester: Secondary | ICD-10-CM

## 2023-07-18 DIAGNOSIS — O0990 Supervision of high risk pregnancy, unspecified, unspecified trimester: Secondary | ICD-10-CM | POA: Insufficient documentation

## 2023-07-18 NOTE — Progress Notes (Signed)
New OB Intake  I connected with Lisa Massey  on 07/18/23 at  9:15 AM EST by In Person Visit and verified that I am speaking with the correct person using two identifiers. Nurse is located at [facility] and pt is located at [facility].  I discussed the limitations, risks, security and privacy concerns of performing an evaluation and management service by telephone and the availability of in person appointments. I also discussed with the patient that there may be a patient responsible charge related to this service. The patient expressed understanding and agreed to proceed.  I explained I am completing New OB Intake today. We discussed EDD of Not found.. Pt is Lisa Massey. I reviewed her allergies, medications and Medical/Surgical/OB history.    Patient Active Problem List   Diagnosis Date Noted   Supervision of other normal pregnancy, antepartum 07/18/2023   Indication for care in labor or delivery 12/17/2018   Language barrier 12/17/2018   Status post delivery at term 04/30/2015   Active labor at term 04/28/2015    Concerns addressed today  Delivery Plans Plans to deliver at [facility]. Discussed the nature of our practice with multiple providers including residents and students. Due to the size of the practice, the delivering provider may not be the same as those providing prenatal care.   Patient is not interested in water birth. Offered upcoming OB visit with CNM to discuss further.  MyChart/Babyscripts MyChart access verified. I explained pt will have some visits in office and some virtually. Babyscripts instructions given and order placed. Patient verifies receipt of registration text/e-mail. Account successfully created and app downloaded. If patient is a candidate for Optimized scheduling, add to sticky note.   Blood Pressure Cuff/Weight Scale Patient is self-pay; explained patient will be given BP cuff at first prenatal appt. Explained after first prenatal appt pt will check weekly and  document in Babyscripts. Patient does have weight scale.  Anatomy US Explained first scheduled [place] will be around 19 weeks. Anatomy US scheduled for 09/04/23 at 0815a.  Is patient a CenteringPregnancy candidate?      Not a candidate due to Language barrier If accepted,    Is patient a Mom+Baby Combined Care candidate?  Not a candidate   If accepted, confirm patient does not intend to move from the area for at least 12 months, then notify Mom+Baby staff  Interested in [place]? If yes, send referral and doula dot phrase.   Is patient a candidate for Babyscripts Optimization?    First visit review I reviewed new OB appt with patient. Explained pt will be seen by Dr. Vergie Living at first visit. Discussed Avelina Laine genetic screening with patient. needs Panorama and Horizon.. Routine prenatal labs  needed at [facility] OB visit.    Last Pap Diagnosis  Date Value Ref Range Status  02/21/2022   Final   - Negative for Intraepithelial Lesions or Malignancy (NILM)  02/21/2022 - Benign reactive/reparative changes  Final    [name], CMA 07/18/2023  11:11 AM

## 2023-07-25 ENCOUNTER — Encounter: Payer: Self-pay | Admitting: Obstetrics and Gynecology

## 2023-07-25 ENCOUNTER — Other Ambulatory Visit (HOSPITAL_COMMUNITY)
Admission: RE | Admit: 2023-07-25 | Discharge: 2023-07-25 | Disposition: A | Discharge status: Home / Self Care | Payer: Self-pay | Source: Ambulatory Visit | Attending: Obstetrics and Gynecology | Admitting: Obstetrics and Gynecology

## 2023-07-25 ENCOUNTER — Ambulatory Visit (INDEPENDENT_AMBULATORY_CARE_PROVIDER_SITE_OTHER): Payer: Self-pay | Admitting: Obstetrics and Gynecology

## 2023-07-25 ENCOUNTER — Other Ambulatory Visit: Payer: Self-pay

## 2023-07-25 VITALS — BP 127/79 | HR 88

## 2023-07-25 DIAGNOSIS — Z603 Acculturation difficulty: Secondary | ICD-10-CM

## 2023-07-25 DIAGNOSIS — O99210 Obesity complicating pregnancy, unspecified trimester: Secondary | ICD-10-CM

## 2023-07-25 DIAGNOSIS — Z758 Other problems related to medical facilities and other health care: Secondary | ICD-10-CM

## 2023-07-25 DIAGNOSIS — O2341 Unspecified infection of urinary tract in pregnancy, first trimester: Secondary | ICD-10-CM

## 2023-07-25 DIAGNOSIS — O09299 Supervision of pregnancy with other poor reproductive or obstetric history, unspecified trimester: Secondary | ICD-10-CM | POA: Insufficient documentation

## 2023-07-25 DIAGNOSIS — O2342 Unspecified infection of urinary tract in pregnancy, second trimester: Secondary | ICD-10-CM

## 2023-07-25 DIAGNOSIS — O0930 Supervision of pregnancy with insufficient antenatal care, unspecified trimester: Secondary | ICD-10-CM | POA: Insufficient documentation

## 2023-07-25 DIAGNOSIS — E669 Obesity, unspecified: Secondary | ICD-10-CM | POA: Insufficient documentation

## 2023-07-25 DIAGNOSIS — O99212 Obesity complicating pregnancy, second trimester: Secondary | ICD-10-CM

## 2023-07-25 DIAGNOSIS — Z113 Encounter for screening for infections with a predominantly sexual mode of transmission: Secondary | ICD-10-CM | POA: Insufficient documentation

## 2023-07-25 DIAGNOSIS — Z6837 Body mass index (BMI) 37.0-37.9, adult: Secondary | ICD-10-CM | POA: Insufficient documentation

## 2023-07-25 DIAGNOSIS — O09292 Supervision of pregnancy with other poor reproductive or obstetric history, second trimester: Secondary | ICD-10-CM

## 2023-07-25 DIAGNOSIS — Z3A14 14 weeks gestation of pregnancy: Secondary | ICD-10-CM

## 2023-07-25 DIAGNOSIS — Z3402 Encounter for supervision of normal first pregnancy, second trimester: Secondary | ICD-10-CM

## 2023-07-25 DIAGNOSIS — Z6833 Body mass index (BMI) 33.0-33.9, adult: Secondary | ICD-10-CM

## 2023-07-25 HISTORY — DX: Supervision of pregnancy with other poor reproductive or obstetric history, unspecified trimester: O09.299

## 2023-07-25 NOTE — Progress Notes (Addendum)
 New OB Note  07/25/2023   Clinic: Center for [facility]  Chief Complaint: new OB  Transfer of Care Patient: no  History of Present Illness: Ms. Lisa Massey is a 33 y.o. G4P3003 at 14/4 weeks (EDC 8/23, based on 10wk u/s). Patient's last menstrual period was 03/18/2023.    Preg complicated by has Language barrier; Supervision of other normal pregnancy, antepartum; UTI in pregnancy, antepartum, first trimester; Late prenatal care; History of postpartum hemorrhage, currently pregnant; BMI 33.0-33.9,adult; and Obesity in pregnancy on their problem list.   No SAB s/s.   ROS: A 12-point review of systems was performed and negative, except as stated in the above HPI.  OBGYN History: As per HPI. OB History  Gravida Para Term Preterm AB Living  4 3 3   3   SAB IAB Ectopic Multiple Live Births     0 3    # Outcome Date GA Lbr Len/2nd Weight Sex Type Anes PTL Lv  4 Current           3 Term 12/17/18 [redacted]w[redacted]d 15:55 / 00:17 9 lb 1 oz (4.11 kg) M Vag-Spont EPI  LIV     Birth Comments: WNL  2 Term 04/28/15 [redacted]w[redacted]d 10:20 / 00:50 8 lb 11 oz (3.941 kg) F Vag-Spont None  LIV  1 Term 05/22/12 [redacted]w[redacted]d  9 lb 4 oz (4.196 kg) M Vag-Spont EPI  LIV   History of pap smears: Yes. Last pap smear 2023 and results were wnl   Past Medical History: History reviewed. No pertinent past medical history.  Past Surgical History: Past Surgical History:  Procedure Laterality Date   APPENDECTOMY     nexplanon      inserted 11/2018 per patient    Family History:  Family History  Problem Relation Age of Onset   Hypertension Father    Cancer Sister        ovarian    Social History:  Social History   Socioeconomic History   Marital status: Single    Spouse name: Not on file   Number of children: Not on file   Years of education: Not on file   Highest education level: Not on file  Occupational History   Not on file  Tobacco Use   Smoking status: Never   Smokeless tobacco: Never   Vaping Use   Vaping status: Never Used  Substance and Sexual Activity   Alcohol use: No   Drug use: No   Sexual activity: Yes    Partners: Male    Birth control/protection: Implant  Other Topics Concern   Not on file  Social History Narrative   Not on file   Social Drivers of Health   Financial Resource Strain: Not on file  Food Insecurity: Not on file  Transportation Needs: Not on file  Physical Activity: Not on file  Stress: Not on file  Social Connections: Not on file  Intimate Partner Violence: Not on file   Allergy: No Known Allergies  Current Outpatient Medications: Prenatal   Physical Exam:   BP 127/79   Pulse 88   LMP 03/18/2023  There is no height or weight on file to calculate BMI.   Vag. Bleeding: None.  General appearance: Well nourished, well developed female in no acute distress.  Neck:  Supple, normal appearance, and no thyromegaly  Cardiovascular: S1, S2 normal, no murmur, rub or gallop, regular rate and rhythm Respiratory:  Clear to auscultation bilateral. Normal respiratory effort Abdomen: positive bowel sounds and no  masses, hernias; diffusely non tender to palpation, non distended Breasts: no s/s.  Neuro/Psych:  Normal mood and affect.  Skin:  Warm and dry.   Laboratory: none  Imaging:  Bedside u/s with SLIUP, +FM, normal FHR 150s, subjectively normal AF  Assessment: patient doing well  Plan: 1. [redacted] weeks gestation of pregnancy (Primary) Patient is self pay Routine care. Offer afp next visit. Pt already scheduled for mfm anatomy u/s D/w her more next visit re: low dose ASA - GC/Chlamydia probe amp ([place])not at [facility] - CBC/D/Plt+RPR+Rh+ABO+RubIgG... - Hemoglobin A1c - TSH Rfx on Abnormal to Free T4  2. Encounter for supervision of normal first pregnancy in second trimester - PANORAMA PRENATAL TEST - Comprehensive metabolic panel - Protein / creatinine ratio, urine  3. BMI 33.0-33.9,adult  4. Obesity in pregnancy  5.  UTI in pregnancy, antepartum, first trimester Test of cure next visit  6. Language barrier In person interpreter used  7. History of postpartum hemorrhage, currently pregnant [place] at delivery  Problem list reviewed and updated.  Follow up in 3-4 weeks.  >50% of 35 min visit spent on counseling and coordination of care.  No follow-ups on file.  Future Appointments  Date Time Provider Department Center  09/04/2023  8:15 AM [facility] NURSE [facility]  09/04/2023  8:30 AM [facility] US1 [facility]  09/04/2023  9:00 AM [facility] PROVIDER 1 [facility]    Cornelia Copa MD Attending Center for [facility])

## 2023-07-26 LAB — CBC/D/PLT+RPR+RH+ABO+RUBIGG...
Antibody Screen: NEGATIVE
Basophils Absolute: 0 10*3/uL (ref 0.0–0.2)
Basos: 0 %
EOS (ABSOLUTE): 0.1 10*3/uL (ref 0.0–0.4)
Eos: 1 %
HCV Ab: NONREACTIVE
HIV Screen 4th Generation wRfx: NONREACTIVE
Hematocrit: 34.6 % (ref 34.0–46.6)
Hemoglobin: 11.4 g/dL (ref 11.1–15.9)
Hepatitis B Surface Ag: NEGATIVE
Immature Grans (Abs): 0 10*3/uL (ref 0.0–0.1)
Immature Granulocytes: 0 %
Lymphocytes Absolute: 1.3 10*3/uL (ref 0.7–3.1)
Lymphs: 18 %
MCH: 28.1 pg (ref 26.6–33.0)
MCHC: 32.9 g/dL (ref 31.5–35.7)
MCV: 85 fL (ref 79–97)
Monocytes Absolute: 0.4 10*3/uL (ref 0.1–0.9)
Monocytes: 6 %
Neutrophils Absolute: 5.2 10*3/uL (ref 1.4–7.0)
Neutrophils: 75 %
Platelets: 236 10*3/uL (ref 150–450)
RBC: 4.06 x10E6/uL (ref 3.77–5.28)
RDW: 13 % (ref 11.7–15.4)
RPR Ser Ql: NONREACTIVE
Rh Factor: POSITIVE
Rubella Antibodies, IGG: 2.03 {index} (ref >0.99)
WBC: 7 10*3/uL (ref 3.4–10.8)

## 2023-07-26 LAB — HEMOGLOBIN A1C
Est. average glucose Bld gHb Est-mCnc: 105 mg/dL
Hgb A1c MFr Bld: 5.3 % (ref 4.8–5.6)

## 2023-07-26 LAB — PROTEIN / CREATININE RATIO, URINE
Creatinine, Urine: 9.7 mg/dL
Protein, Ur: <4 mg/dL

## 2023-07-26 LAB — COMPREHENSIVE METABOLIC PANEL
ALT: 10 [IU]/L (ref 0–32)
AST: 13 [IU]/L (ref 0–40)
Albumin: 3.6 g/dL — ABNORMAL LOW (ref 3.9–4.9)
Alkaline Phosphatase: 50 [IU]/L (ref 44–121)
BUN/Creatinine Ratio: 8 — ABNORMAL LOW (ref 9–23)
BUN: 4 mg/dL — ABNORMAL LOW (ref 6–20)
Bilirubin Total: <0.2 mg/dL (ref 0.0–1.2)
CO2: 18 mmol/L — ABNORMAL LOW (ref 20–29)
Calcium: 9.7 mg/dL (ref 8.7–10.2)
Chloride: 104 mmol/L (ref 96–106)
Creatinine, Ser: 0.49 mg/dL — ABNORMAL LOW (ref 0.57–1.00)
Globulin, Total: 2.3 g/dL (ref 1.5–4.5)
Glucose: 77 mg/dL (ref 70–99)
Potassium: 3.6 mmol/L (ref 3.5–5.2)
Sodium: 139 mmol/L (ref 134–144)
Total Protein: 5.9 g/dL — ABNORMAL LOW (ref 6.0–8.5)
eGFR: 128 mL/min/{1.73_m2} (ref >59)

## 2023-07-26 LAB — GC/CHLAMYDIA PROBE AMP (~~LOC~~) NOT AT ARMC
Chlamydia: NEGATIVE
Comment: NEGATIVE
Comment: NORMAL
Neisseria Gonorrhea: NEGATIVE

## 2023-07-26 LAB — HCV INTERPRETATION

## 2023-07-26 LAB — TSH RFX ON ABNORMAL TO FREE T4: TSH: 1.45 u[IU]/mL (ref 0.450–4.500)

## 2023-08-23 ENCOUNTER — Other Ambulatory Visit: Payer: Self-pay

## 2023-08-23 ENCOUNTER — Ambulatory Visit (INDEPENDENT_AMBULATORY_CARE_PROVIDER_SITE_OTHER): Payer: Self-pay | Admitting: Family Medicine

## 2023-08-23 VITALS — BP 116/86 | HR 84 | Wt 186.9 lb

## 2023-08-23 DIAGNOSIS — Z23 Encounter for immunization: Secondary | ICD-10-CM

## 2023-08-23 DIAGNOSIS — O2341 Unspecified infection of urinary tract in pregnancy, first trimester: Secondary | ICD-10-CM

## 2023-08-23 DIAGNOSIS — Z758 Other problems related to medical facilities and other health care: Secondary | ICD-10-CM

## 2023-08-23 DIAGNOSIS — Z3A18 18 weeks gestation of pregnancy: Secondary | ICD-10-CM

## 2023-08-23 DIAGNOSIS — Z3480 Encounter for supervision of other normal pregnancy, unspecified trimester: Secondary | ICD-10-CM

## 2023-08-23 DIAGNOSIS — O2342 Unspecified infection of urinary tract in pregnancy, second trimester: Secondary | ICD-10-CM

## 2023-08-23 DIAGNOSIS — Z603 Acculturation difficulty: Secondary | ICD-10-CM

## 2023-08-23 NOTE — Progress Notes (Signed)
   PRENATAL VISIT NOTE  Subjective:  Lisa Massey is a 33 y.o. [phone] at [redacted]w[redacted]d being seen today for ongoing prenatal care.  She is currently monitored for the following issues for this low-risk pregnancy and has Language barrier; Supervision of other normal pregnancy, antepartum; UTI in pregnancy, antepartum, first trimester; Late prenatal care; History of postpartum hemorrhage, currently pregnant; BMI 33.0-33.9,adult; and Obesity in pregnancy on their problem list.  Patient reports no complaints.  Contractions: Not present. Vag. Bleeding: None.  Movement: Present (occasionally). Denies leaking of fluid.   The following portions of the patient's history were reviewed and updated as appropriate: allergies, current medications, past family history, past medical history, past social history, past surgical history and problem list.   Objective:   Vitals:   08/23/23 0843  BP: 116/86  Pulse: 84  Weight: 186 lb 14.4 oz (84.8 kg)    Fetal Status: Fetal Heart Rate (bpm): 148   Movement: Present (occasionally)     General:  Alert, oriented and cooperative. Patient is in no acute distress.  Skin: Skin is warm and dry. No rash noted.   Cardiovascular: Normal heart rate noted  Respiratory: Normal respiratory effort, no problems with respiration noted  Abdomen: Soft, gravid, appropriate for gestational age.  Pain/Pressure: Present (occasion pain lower right abdomen)     Pelvic: Cervical exam deferred        Extremities: Normal range of motion.  Edema: None  Mental Status: Normal mood and affect. Normal behavior. Normal judgment and thought content.   Assessment and Plan:  Pregnancy: G4P3003 at [redacted]w[redacted]d 1. Supervision of other normal pregnancy, antepartum (Primary) AFP today - AFP, Serum, Open Spina Bifida - Babyscripts Schedule Optimization - Flu vaccine trivalent PF, 6mos and older(Flulaval,Afluria,Fluarix,Fluzone)  2. UTI in pregnancy, antepartum, first trimester TOC today - Culture, OB  Urine  3. Language barrier Spanish interpreter: Eda used  4. [redacted] weeks gestation of pregnancy   Preterm labor symptoms and general obstetric precautions including but not limited to vaginal bleeding, contractions, leaking of fluid and fetal movement were reviewed in detail with the patient. Please refer to After Visit Summary for other counseling recommendations.   Return in about 4 weeks (around 09/20/2023) for [facility].  Future Appointments  Date Time Provider Department Center  09/04/2023  8:15 AM [facility] NURSE [facility]  09/04/2023  8:30 AM [facility] US1 [facility]  09/04/2023  9:00 AM [facility] PROVIDER 1 [facility]  09/18/2023  8:15 AM [place], Clarita Crane, CNM [facility]    Reva Bores, MD

## 2023-08-25 LAB — AFP, SERUM, OPEN SPINA BIFIDA
AFP MoM: 1.67
AFP Value: 67.2 ng/mL
Gest. Age on Collection Date: 18.5 wk
Maternal Age At EDD: 33.8 a
OSBR Risk 1 IN: 1757
Test Results:: NEGATIVE
Weight: 187 [lb_av]

## 2023-08-25 LAB — CULTURE, OB URINE

## 2023-08-25 LAB — URINE CULTURE, OB REFLEX

## 2023-08-31 ENCOUNTER — Ambulatory Visit: Payer: Self-pay | Attending: Obstetrics and Gynecology | Admitting: *Deleted

## 2023-08-31 DIAGNOSIS — Z3A19 19 weeks gestation of pregnancy: Secondary | ICD-10-CM

## 2023-08-31 DIAGNOSIS — Z3689 Encounter for other specified antenatal screening: Secondary | ICD-10-CM

## 2023-08-31 NOTE — Progress Notes (Signed)
 New OB Intake  I connected with Lisa Massey  on 08/31/23 at 1036 by phone and verified that I am speaking with the correct person using two identifiers. Nurse is located at [facility] and pt is located at home.   I explained I am completing New Patient Intake today. We discussed EDD of 01/19/2024, by Ultrasound. Pt is G4P3003. I reviewed her allergies, medications and Medical/Surgical/OB history.    Patient Active Problem List   Diagnosis Date Noted   UTI in pregnancy, antepartum, first trimester 07/25/2023   History of postpartum hemorrhage, currently pregnant 07/25/2023   BMI 33.0-33.9,adult 07/25/2023   Obesity in pregnancy 07/25/2023   Late prenatal care    Supervision of other normal pregnancy, antepartum 07/18/2023   Language barrier 12/17/2018    Patient advised of first appointment in our office and when to arrive.   All questions were answered.

## 2023-09-04 ENCOUNTER — Ambulatory Visit: Payer: Self-pay | Attending: Obstetrics and Gynecology

## 2023-09-04 ENCOUNTER — Ambulatory Visit: Payer: Self-pay

## 2023-09-04 ENCOUNTER — Other Ambulatory Visit: Payer: Self-pay | Admitting: *Deleted

## 2023-09-04 ENCOUNTER — Ambulatory Visit (HOSPITAL_BASED_OUTPATIENT_CLINIC_OR_DEPARTMENT_OTHER): Payer: Self-pay | Admitting: Maternal & Fetal Medicine

## 2023-09-04 VITALS — BP 118/61 | HR 78

## 2023-09-04 DIAGNOSIS — Z3A20 20 weeks gestation of pregnancy: Secondary | ICD-10-CM

## 2023-09-04 DIAGNOSIS — Z3480 Encounter for supervision of other normal pregnancy, unspecified trimester: Secondary | ICD-10-CM

## 2023-09-04 DIAGNOSIS — Z363 Encounter for antenatal screening for malformations: Secondary | ICD-10-CM | POA: Insufficient documentation

## 2023-09-04 DIAGNOSIS — Z362 Encounter for other antenatal screening follow-up: Secondary | ICD-10-CM

## 2023-09-04 DIAGNOSIS — E669 Obesity, unspecified: Secondary | ICD-10-CM

## 2023-09-04 DIAGNOSIS — O4442 Low lying placenta NOS or without hemorrhage, second trimester: Secondary | ICD-10-CM

## 2023-09-04 DIAGNOSIS — O0932 Supervision of pregnancy with insufficient antenatal care, second trimester: Secondary | ICD-10-CM

## 2023-09-04 DIAGNOSIS — O321XX0 Maternal care for breech presentation, not applicable or unspecified: Secondary | ICD-10-CM | POA: Insufficient documentation

## 2023-09-04 DIAGNOSIS — O99212 Obesity complicating pregnancy, second trimester: Secondary | ICD-10-CM | POA: Insufficient documentation

## 2023-09-04 NOTE — Progress Notes (Signed)
 Patient information  Patient Name: Lisa Massey  Patient MRN:   [id number]  Referring practice: MFM Referring Provider: [facility])  MFM CONSULT  Lisa Massey is a 33 y.o. [phone] at [redacted]w[redacted]d here for ultrasound and consultation. Patient Active Problem List   Diagnosis Date Noted   UTI in pregnancy, antepartum, first trimester 07/25/2023   History of postpartum hemorrhage, currently pregnant 07/25/2023   BMI 33.0-33.9,adult 07/25/2023   Obesity in pregnancy 07/25/2023   Late prenatal care    Supervision of other normal pregnancy, antepartum 07/18/2023   Language barrier 12/17/2018   Lisa Massey has a pregnancy with the complications mentioned in the problem list. During today's visit we focused on the following concerns:   RE low lying placenta: Today's ultrasound showed a low-lying placenta.  The patient currently denies vaginal bleeding but had some bleeding earlier in the pregnancy.  I discussed the concern for delivery if the low-lying placenta persists but that typically this resolves as the pregnancy goes on.  If the placental edge is at least 1 to 2 cm away from the internal os and a vaginal delivery is preferred unless other contraindications exist.  A follow-up ultrasound will be done in about 4 weeks to assess the fetal growth as well as the placental location.  Bleeding precautions were given to the patient.  RE elevated BMI I discussed the potential complications associated with obesity in pregnancy.  These complications include but are not limited to increased risk of excessive maternal weight gain, fetal growth abnormalities, fetal congenital disorders, inability to visualize fetal anatomic structures on ultrasound, gestational diabetes, hypertensive disorders of pregnancy, operative birth including cesarean delivery or assisted vaginal delivery, delayed wound healing and many long-term health complications.  I discussed the need for continued  growth ultrasounds and possibly antenatal testing depending upon how the pregnancy course progresses.  Maternal weight gain should be limited to 10 to 20 pounds during the pregnancy.  While normal weight loss may occur during the first and early second trimester, efforts to actively lose weight with the use of medication is not recommended during pregnancy.  A whole food diet and regular exercise of at least 15 to 30 minutes of moderately strenuous activity is recommended in the absence of any contraindications. Weight loss with the use of medications is not recommended during pregnancy.  If other existing comorbidities are present then 81 mg of aspirin should be considered for preeclampsia risk reduction.   Sonographic findings Single intrauterine pregnancy at 20w 3d  Fetal cardiac activity:  Observed and appears normal. Presentation: Breech. The anatomic structures that were well seen appear normal without evidence of soft markers. Due to poor acoustic windows some structures remain suboptimally visualized. Fetal biometry shows the estimated fetal weight at the 27 percentile.  Amniotic fluid: Within normal limits. Placenta: Posterior, low-lying, 0.75 cm from int os. Adnexa: No abnormality visualized. Cervical length: 3.4 cm.  There are limitations of prenatal ultrasound such as the inability to detect certain abnormalities due to poor visualization. Various factors such as fetal position, gestational age and maternal body habitus may increase the difficulty in visualizing the fetal anatomy.    Recommendations -EDD should be 01/19/2024 based on  Early Ultrasound  (06/28/23). -Detailed ultrasound was done today without abnormalities but a low-lying placenta was seen -Baseline preeclampsia labs: CMP, CBC and urine protein/creatinine ratio if not previously completed. -Aspirin 81 mg for preeclampsia prophylaxis -Follow-up anatomy and fetal growth in 4 to  6 weeks -Serial growth ultrasounds starting  around 28 weeks to monitor for fetal growth restriction -Delivery timing and route to be determined based on the presence or absence of a low-lying placenta. -Continue routine prenatal care with referring OB provider  An in person interpreter was used for today's visit.  Review of Systems: A review of systems was performed and was negative except per HPI   Vitals and Physical Exam    09/04/2023    8:06 AM 08/23/2023    8:43 AM 07/25/2023    3:06 PM  Vitals with BMI  Weight  186 lbs 14 oz   Systolic 118 116 161  Diastolic 61 86 79  Pulse 78 84 88    Sitting comfortably on the sonogram table Nonlabored breathing Normal rate and rhythm Abdomen is nontender  Past pregnancies OB History  Gravida Para Term Preterm AB Living  4 3 3   3   SAB IAB Ectopic Multiple Live Births     0 3    # Outcome Date GA Lbr Len/2nd Weight Sex Type Anes PTL Lv  4 Current           3 Term 12/17/18 [redacted]w[redacted]d 15:55 / 00:17 9 lb 1 oz (4.11 kg) M Vag-Spont EPI  LIV     Birth Comments: WNL  2 Term 04/28/15 [redacted]w[redacted]d 10:20 / 00:50 8 lb 11 oz (3.941 kg) F Vag-Spont None  LIV  1 Term 05/22/12 [redacted]w[redacted]d  9 lb 4 oz (4.196 kg) M Vag-Spont EPI  LIV     I spent 30 minutes reviewing the patients chart, including labs and images as well as counseling the patient about her medical conditions. Greater than 50% of the time was spent in direct face-to-face patient counseling.  Braxton Feathers, DO Maternal fetal medicine, [place]   09/04/2023  8:52 AM

## 2023-09-14 ENCOUNTER — Encounter: Payer: Self-pay | Admitting: Obstetrics and Gynecology

## 2023-09-14 DIAGNOSIS — O4440 Low lying placenta NOS or without hemorrhage, unspecified trimester: Secondary | ICD-10-CM | POA: Insufficient documentation

## 2023-09-18 ENCOUNTER — Ambulatory Visit: Payer: Self-pay | Admitting: Family Medicine

## 2023-09-18 ENCOUNTER — Other Ambulatory Visit: Payer: Self-pay

## 2023-09-18 ENCOUNTER — Encounter: Payer: Self-pay | Admitting: Family Medicine

## 2023-09-18 VITALS — BP 111/75 | HR 73 | Wt 189.4 lb

## 2023-09-18 DIAGNOSIS — R0602 Shortness of breath: Secondary | ICD-10-CM

## 2023-09-18 DIAGNOSIS — O26892 Other specified pregnancy related conditions, second trimester: Secondary | ICD-10-CM

## 2023-09-18 DIAGNOSIS — Z3A22 22 weeks gestation of pregnancy: Secondary | ICD-10-CM

## 2023-09-18 DIAGNOSIS — Z758 Other problems related to medical facilities and other health care: Secondary | ICD-10-CM

## 2023-09-18 DIAGNOSIS — O4442 Low lying placenta NOS or without hemorrhage, second trimester: Secondary | ICD-10-CM

## 2023-09-18 DIAGNOSIS — O99210 Obesity complicating pregnancy, unspecified trimester: Secondary | ICD-10-CM

## 2023-09-18 DIAGNOSIS — O26893 Other specified pregnancy related conditions, third trimester: Secondary | ICD-10-CM

## 2023-09-18 DIAGNOSIS — O4440 Low lying placenta NOS or without hemorrhage, unspecified trimester: Secondary | ICD-10-CM

## 2023-09-18 DIAGNOSIS — Z3480 Encounter for supervision of other normal pregnancy, unspecified trimester: Secondary | ICD-10-CM

## 2023-09-18 DIAGNOSIS — Z603 Acculturation difficulty: Secondary | ICD-10-CM

## 2023-09-18 DIAGNOSIS — O99212 Obesity complicating pregnancy, second trimester: Secondary | ICD-10-CM

## 2023-09-18 LAB — CBC
Hematocrit: 34.8 % (ref 34.0–46.6)
Hemoglobin: 11.3 g/dL (ref 11.1–15.9)
MCH: 28.3 pg (ref 26.6–33.0)
MCHC: 32.5 g/dL (ref 31.5–35.7)
MCV: 87 fL (ref 79–97)
Platelets: 253 10*3/uL (ref 150–450)
RBC: 3.99 x10E6/uL (ref 3.77–5.28)
RDW: 13 % (ref 11.7–15.4)
WBC: 7.5 10*3/uL (ref 3.4–10.8)

## 2023-09-18 MED ORDER — CETIRIZINE HCL 10 MG PO TABS: 10.0000 mg | ORAL_TABLET | Freq: Every day | ORAL | 3 refills | Status: DC

## 2023-09-18 NOTE — Progress Notes (Signed)
   PRENATAL VISIT NOTE  Subjective:  Lisa Massey is a 33 y.o. [phone] at [redacted]w[redacted]d being seen today for ongoing prenatal care.  She is currently monitored for the following issues for this low-risk pregnancy and has Language barrier; Supervision of other normal pregnancy, antepartum; UTI in pregnancy, antepartum, first trimester; Late prenatal care; History of postpartum hemorrhage, currently pregnant; BMI 37.0-37.9, adult - pregravid; Obesity in pregnancy; and Low lying placenta, antepartum on their problem list.  Patient reports  occasional shortness of breath after activity. This started 3 weeks ago and overall has been gradually improving. Denies chest pain, rhinorrhea, cough, congestion. Denies history of asthma.   Contractions: Not present. Vag. Bleeding: None.  Movement: Present. Denies leaking of fluid.   The following portions of the patient's history were reviewed and updated as appropriate: allergies, current medications, past family history, past medical history, past social history, past surgical history and problem list.   Objective:   Vitals:   09/18/23 0842  BP: 111/75  Pulse: 73  Weight: 189 lb 6.4 oz (85.9 kg)    Fetal Status: Fetal Heart Rate (bpm): 148 Fundal Height: 24 cm Movement: Present     General:  Alert, oriented and cooperative. Patient is in no acute distress.  Skin: Skin is warm and dry. No rash noted.   Cardiovascular: Normal heart rate noted  Respiratory: Normal respiratory effort, no problems with respiration noted  Abdomen: Soft, gravid, appropriate for gestational age.  Pain/Pressure: Absent     Pelvic: Cervical exam deferred        Extremities: Normal range of motion.  Edema: Trace  Mental Status: Normal mood and affect. Normal behavior. Normal judgment and thought content.   Assessment and Plan:  Pregnancy: G4P3003 at [redacted]w[redacted]d There are no diagnoses linked to this encounter. Preterm labor symptoms and general obstetric precautions including but not  limited to vaginal bleeding, contractions, leaking of fluid and fetal movement were reviewed in detail with the patient. Please refer to After Visit Summary for other counseling recommendations.   1. Supervision of other normal pregnancy, antepartum (Primary) BP and FHR normal Doing well, feeling regular movement   2. Language barrier Due to language barrier, an interpreter was present during the history-taking, physical exam, and subsequent discussion with this patient.  3. Low lying placenta, antepartum Noted on US  09/04/2023, discussed at MFM appt same day.  Plan for follow-up US  10/02/2023.  4. Obesity in pregnancy MFM discusses potential complications, need for growth US  +/- antenatal testing starting at 28 weeks. BMI is currently only risk factor for preeclampsia. No history of preeclampsia, gHTN, gDM. Wait on 28 week lab results before deciding to start daily aspirin 81 mg for preeclampsia prophylaxis.   5. [redacted] weeks gestation of pregnancy  6. Shortness of breath during pregnancy, third trimester Discussed expectant management versus prn albuterol inhaler versus trial antihistamine for potential allergies. Patient prefers trial of antihistamine, prescription for cetirizine  sent to pharmacy.    Return in about 4 weeks (around 10/16/2023) for LOB.  Future Appointments  Date Time Provider Department Center  10/02/2023  7:00 AM [facility] PROVIDER 1 [facility]  10/02/2023  7:30 AM [facility] US3 [facility]  10/17/2023  8:15 AM Raynell Caller, MD [facility]  10/17/2023  8:50 AM [facility] LAB [facility]  10/30/2023  7:00 AM [facility] PROVIDER 1 [facility]  10/30/2023  7:30 AM [facility] US3 [facility]  11/05/2023  8:55 AM Raynell Caller, MD [facility]    Noreene Bearded, PA

## 2023-09-19 LAB — COMPREHENSIVE METABOLIC PANEL WITH GFR
ALT: 10 IU/L (ref 0–32)
AST: 10 IU/L (ref 0–40)
Albumin: 3.5 g/dL — ABNORMAL LOW (ref 3.9–4.9)
Alkaline Phosphatase: 65 IU/L (ref 44–121)
BUN/Creatinine Ratio: 10 (ref 9–23)
BUN: 4 mg/dL — ABNORMAL LOW (ref 6–20)
Bilirubin Total: <0.2 mg/dL (ref 0.0–1.2)
CO2: 20 mmol/L (ref 20–29)
Calcium: 8.8 mg/dL (ref 8.7–10.2)
Chloride: 106 mmol/L (ref 96–106)
Creatinine, Ser: 0.42 mg/dL — ABNORMAL LOW (ref 0.57–1.00)
Globulin, Total: 2.3 g/dL (ref 1.5–4.5)
Glucose: 79 mg/dL (ref 70–99)
Potassium: 4.2 mmol/L (ref 3.5–5.2)
Sodium: 139 mmol/L (ref 134–144)
Total Protein: 5.8 g/dL — ABNORMAL LOW (ref 6.0–8.5)
eGFR: 132 mL/min/{1.73_m2} (ref >59)

## 2023-09-20 ENCOUNTER — Encounter: Payer: Self-pay | Admitting: Advanced Practice Midwife

## 2023-09-25 LAB — HORIZON CUSTOM: REPORT SUMMARY: NEGATIVE

## 2023-10-02 ENCOUNTER — Ambulatory Visit: Payer: Self-pay

## 2023-10-02 ENCOUNTER — Ambulatory Visit: Payer: Self-pay | Attending: Maternal & Fetal Medicine | Admitting: Maternal & Fetal Medicine

## 2023-10-02 VITALS — BP 120/67 | HR 77

## 2023-10-02 DIAGNOSIS — Z3A24 24 weeks gestation of pregnancy: Secondary | ICD-10-CM

## 2023-10-02 DIAGNOSIS — O321XX0 Maternal care for breech presentation, not applicable or unspecified: Secondary | ICD-10-CM | POA: Insufficient documentation

## 2023-10-02 DIAGNOSIS — O4442 Low lying placenta NOS or without hemorrhage, second trimester: Secondary | ICD-10-CM

## 2023-10-02 DIAGNOSIS — O99212 Obesity complicating pregnancy, second trimester: Secondary | ICD-10-CM

## 2023-10-02 DIAGNOSIS — Z3480 Encounter for supervision of other normal pregnancy, unspecified trimester: Secondary | ICD-10-CM

## 2023-10-02 DIAGNOSIS — O0932 Supervision of pregnancy with insufficient antenatal care, second trimester: Secondary | ICD-10-CM | POA: Insufficient documentation

## 2023-10-02 DIAGNOSIS — E669 Obesity, unspecified: Secondary | ICD-10-CM

## 2023-10-02 DIAGNOSIS — O4440 Low lying placenta NOS or without hemorrhage, unspecified trimester: Secondary | ICD-10-CM

## 2023-10-02 DIAGNOSIS — Z362 Encounter for other antenatal screening follow-up: Secondary | ICD-10-CM | POA: Insufficient documentation

## 2023-10-02 NOTE — Progress Notes (Signed)
   Patient information  Patient Name: Lisa Massey  Patient MRN:   [id number]  Referring practice: MFM Referring Provider: [facility])  MFM CONSULT  Lisa Massey is a 33 y.o. [phone] at [redacted]w[redacted]d here for ultrasound and consultation. Patient Active Problem List   Diagnosis Date Noted   Low lying placenta, antepartum 09/14/2023   UTI in pregnancy, antepartum, first trimester 07/25/2023   History of postpartum hemorrhage, currently pregnant 07/25/2023   BMI 37.0-37.9, adult - pregravid 07/25/2023   Obesity in pregnancy 07/25/2023   Late prenatal care    Supervision of other normal pregnancy, antepartum 07/18/2023   Language barrier 12/17/2018    Lisa Massey is doing well today with no acute concerns.  RE low-lying: resolved on today's US . Vaginal delivery is preferred.    Sonographic findings Single intrauterine pregnancy at 24w 3d.  Fetal cardiac activity:  Observed and appears normal. Presentation: Breech. Interval fetal anatomy appears normal. Fetal biometry shows the estimated fetal weight at the 33 percentile. Amniotic fluid volume: Within normal limits. MVP: 3.85 cm. Placenta: Posterior.  There are limitations of prenatal ultrasound such as the inability to detect certain abnormalities due to poor visualization. Various factors such as fetal position, gestational age and maternal body habitus may increase the difficulty in visualizing the fetal anatomy.    Recommendations 1. Serial growth ultrasounds every 4-6 weeks until delivery  Review of Systems: A review of systems was performed and was negative except per HPI   Vitals and Physical Exam    10/02/2023    7:37 AM 09/18/2023    8:42 AM 09/04/2023    8:06 AM  Vitals with BMI  Weight  189 lbs 6 oz   Systolic 120 111 147  Diastolic 67 75 61  Pulse 77 73 78    Sitting comfortably on the sonogram table Nonlabored breathing Normal rate and rhythm Abdomen is nontender  Past  pregnancies OB History  Gravida Para Term Preterm AB Living  4 3 3   3   SAB IAB Ectopic Multiple Live Births     0 3    # Outcome Date GA Lbr Len/2nd Weight Sex Type Anes PTL Lv  4 Current           3 Term 12/17/18 [redacted]w[redacted]d 15:55 / 00:17 9 lb 1 oz (4.11 kg) M Vag-Spont EPI  LIV     Birth Comments: WNL  2 Term 04/28/15 [redacted]w[redacted]d 10:20 / 00:50 8 lb 11 oz (3.941 kg) F Vag-Spont None  LIV  1 Term 05/22/12 [redacted]w[redacted]d  9 lb 4 oz (4.196 kg) M Vag-Spont EPI  LIV     I spent 10 minutes reviewing the patients chart, including labs and images as well as counseling the patient about her medical conditions. Greater than 50% of the time was spent in direct face-to-face patient counseling.  Penney Bowling  MFM, [facility]   10/02/2023  8:18 AM

## 2023-10-10 ENCOUNTER — Other Ambulatory Visit: Payer: Self-pay

## 2023-10-10 ENCOUNTER — Telehealth: Payer: Self-pay

## 2023-10-10 DIAGNOSIS — Z3480 Encounter for supervision of other normal pregnancy, unspecified trimester: Secondary | ICD-10-CM

## 2023-10-10 NOTE — Telephone Encounter (Addendum)
 [place], Babyscripts  P [facility]-Babyscripts Htn Md; P [facility] Clinical Pool Babyscripts Trigger Notification for Lisa Massey, [place] Trigger Severity: Elevated Blood Pressure Value: 148/71 Reading Date: 2023-10-07 Symptoms: Swelling   Called patient with interpreter Eda. Reports she rechecked BP on Sunday, 126/70. Reviewed need to check weekly or with any headache, vision change, or dizziness. In future if elevated 140/90 or higher she will recheck after 30 minutes and contact the office if it remains elevated.

## 2023-10-17 ENCOUNTER — Other Ambulatory Visit: Payer: Self-pay

## 2023-10-17 ENCOUNTER — Ambulatory Visit (INDEPENDENT_AMBULATORY_CARE_PROVIDER_SITE_OTHER): Payer: Self-pay | Admitting: Certified Nurse Midwife

## 2023-10-17 VITALS — BP 125/83 | HR 80 | Wt 188.3 lb

## 2023-10-17 DIAGNOSIS — Z3480 Encounter for supervision of other normal pregnancy, unspecified trimester: Secondary | ICD-10-CM

## 2023-10-17 DIAGNOSIS — Z3A26 26 weeks gestation of pregnancy: Secondary | ICD-10-CM

## 2023-10-17 DIAGNOSIS — Z23 Encounter for immunization: Secondary | ICD-10-CM

## 2023-10-17 NOTE — Progress Notes (Addendum)
   PRENATAL VISIT NOTE  Subjective:  Lisa Massey is a 33 y.o. [phone] at [redacted]w[redacted]d being seen today for ongoing prenatal care.  She is currently monitored for the following issues for this low-risk pregnancy and has Language barrier; Supervision of other normal pregnancy, antepartum; UTI in pregnancy, antepartum, first trimester; Late prenatal care; History of postpartum hemorrhage, currently pregnant; BMI 37.0-37.9, adult - pregravid; Obesity in pregnancy; and Low lying placenta, antepartum (resolved) on their problem list.  Patient reports headaches every 2-3 days that resolve with tylenol . She denies skipping meals and reports adequate water intake. She is also concerned that she is able to fall asleep but not stay asleep.  Contractions: Irritability. Vag. Bleeding: None.  Movement: Present. Denies leaking of fluid.   The following portions of the patient's history were reviewed and updated as appropriate: allergies, current medications, past family history, past medical history, past social history, past surgical history and problem list.   Objective:    Vitals:   10/17/23 0858  BP: 125/83  Pulse: 80  Weight: 85.4 kg    Fetal Status:  Fetal Heart Rate (bpm): 135 Fundal Height: 27 cm Movement: Present    General: Alert, oriented and cooperative. Patient is in no acute distress.  Skin: Skin is warm and dry. No rash noted.   Cardiovascular: Normal heart rate noted  Respiratory: Normal respiratory effort, no problems with respiration noted  Abdomen: Soft, gravid, appropriate for gestational age.  Pain/Pressure: Absent     Pelvic: Cervical exam deferred        Extremities: Normal range of motion.  Edema: Trace  Mental Status: Normal mood and affect. Normal behavior. Normal judgment and thought content.   Assessment and Plan:  Pregnancy: G4P3003 at [redacted]w[redacted]d 1. Supervision of other normal pregnancy, antepartum (Primary) - Overall doing well with pregnancy. Vigorous fetal movement.  -  Recommended taking Magnesium to improve sleep patterns. - Reassured by headaches response to tylenol . Headaches in pregnancy can sometimes be caused by anemia or elevated blood pressures. CBC is being drawn today. BP WNL today. Patient should come in for examination if headaches persist after taking tylenol .   2. [redacted] weeks gestation of pregnancy - Routine OB Care  Preterm labor symptoms and general obstetric precautions including but not limited to vaginal bleeding, contractions, leaking of fluid and fetal movement were reviewed in detail with the patient. Please refer to After Visit Summary for other counseling recommendations.     Future Appointments  Date Time Provider Department Center  10/17/2023  9:55 AM [facility]  10/30/2023  7:00 AM [facility] PROVIDER 1 [facility]  10/30/2023  7:30 AM [facility] US3 [facility]  11/05/2023  8:55 AM Raynell Caller, MD [facility]    Wilford Hanks, Student-MidWife

## 2023-10-17 NOTE — Patient Instructions (Addendum)
 Magnesium 200-400mg  for sleep.

## 2023-10-18 LAB — GLUCOSE TOLERANCE, 2 HOURS W/ 1HR
Glucose, 1 hour: 143 mg/dL (ref 70–179)
Glucose, 2 hour: 111 mg/dL (ref 70–152)
Glucose, Fasting: 84 mg/dL (ref 70–91)

## 2023-10-18 LAB — CBC
Hematocrit: 34.9 % (ref 34.0–46.6)
Hemoglobin: 11.3 g/dL (ref 11.1–15.9)
MCH: 28.1 pg (ref 26.6–33.0)
MCHC: 32.4 g/dL (ref 31.5–35.7)
MCV: 87 fL (ref 79–97)
Platelets: 234 10*3/uL (ref 150–450)
RBC: 4.02 x10E6/uL (ref 3.77–5.28)
RDW: 12.8 % (ref 11.7–15.4)
WBC: 7.6 10*3/uL (ref 3.4–10.8)

## 2023-10-18 LAB — HIV ANTIBODY (ROUTINE TESTING W REFLEX): HIV Screen 4th Generation wRfx: NONREACTIVE

## 2023-10-18 LAB — RPR: RPR Ser Ql: NONREACTIVE

## 2023-10-23 ENCOUNTER — Ambulatory Visit: Payer: Self-pay | Admitting: Obstetrics and Gynecology

## 2023-10-23 DIAGNOSIS — O2341 Unspecified infection of urinary tract in pregnancy, first trimester: Secondary | ICD-10-CM

## 2023-10-23 DIAGNOSIS — Z3480 Encounter for supervision of other normal pregnancy, unspecified trimester: Secondary | ICD-10-CM

## 2023-10-30 ENCOUNTER — Ambulatory Visit: Payer: Self-pay | Attending: Obstetrics and Gynecology | Admitting: Obstetrics and Gynecology

## 2023-10-30 ENCOUNTER — Ambulatory Visit: Payer: Self-pay

## 2023-10-30 VITALS — BP 129/76

## 2023-10-30 DIAGNOSIS — O4443 Low lying placenta NOS or without hemorrhage, third trimester: Secondary | ICD-10-CM

## 2023-10-30 DIAGNOSIS — E669 Obesity, unspecified: Secondary | ICD-10-CM

## 2023-10-30 DIAGNOSIS — Z362 Encounter for other antenatal screening follow-up: Secondary | ICD-10-CM | POA: Insufficient documentation

## 2023-10-30 DIAGNOSIS — O99213 Obesity complicating pregnancy, third trimester: Secondary | ICD-10-CM

## 2023-10-30 DIAGNOSIS — Z3A28 28 weeks gestation of pregnancy: Secondary | ICD-10-CM | POA: Insufficient documentation

## 2023-10-30 DIAGNOSIS — Z3480 Encounter for supervision of other normal pregnancy, unspecified trimester: Secondary | ICD-10-CM

## 2023-10-30 DIAGNOSIS — O0932 Supervision of pregnancy with insufficient antenatal care, second trimester: Secondary | ICD-10-CM

## 2023-10-30 DIAGNOSIS — O0933 Supervision of pregnancy with insufficient antenatal care, third trimester: Secondary | ICD-10-CM | POA: Insufficient documentation

## 2023-10-30 NOTE — Progress Notes (Signed)
 After review, MFM consult with provider is not indicated for today  Lisa Clever, MD 10/30/2023 8:10 AM  [facility]

## 2023-11-05 ENCOUNTER — Other Ambulatory Visit: Payer: Self-pay

## 2023-11-05 ENCOUNTER — Ambulatory Visit: Payer: Self-pay | Admitting: Obstetrics and Gynecology

## 2023-11-05 VITALS — BP 131/85 | HR 97 | Wt 191.1 lb

## 2023-11-05 DIAGNOSIS — Z3A29 29 weeks gestation of pregnancy: Secondary | ICD-10-CM

## 2023-11-05 DIAGNOSIS — Z3483 Encounter for supervision of other normal pregnancy, third trimester: Secondary | ICD-10-CM

## 2023-11-05 DIAGNOSIS — Z603 Acculturation difficulty: Secondary | ICD-10-CM

## 2023-11-05 DIAGNOSIS — Z758 Other problems related to medical facilities and other health care: Secondary | ICD-10-CM

## 2023-11-05 NOTE — Progress Notes (Signed)
   PRENATAL VISIT NOTE  Subjective:  Lisa Massey is a 33 y.o. [phone] at [redacted]w[redacted]d being seen today for ongoing prenatal care.  She is currently monitored for the following issues for this low-risk pregnancy and has Language barrier; Supervision of other normal pregnancy, antepartum; UTI in pregnancy, antepartum, first trimester; Late prenatal care; History of postpartum hemorrhage, currently pregnant; BMI 37.0-37.9, adult - pregravid; Obesity in pregnancy; and Low lying placenta, antepartum (resolved) on their problem list.  Patient reports no complaints.  Contractions: Not present. Vag. Bleeding: None.  Movement: Present. Denies leaking of fluid.   The following portions of the patient's history were reviewed and updated as appropriate: allergies, current medications, past family history, past medical history, past social history, past surgical history and problem list.   Objective:    Vitals:   11/05/23 0858  BP: 131/85  Pulse: 97  Weight: 191 lb 1 oz (86.7 kg)    Fetal Status:  Fetal Heart Rate (bpm): 148 Fundal Height: 31 cm Movement: Present    General: Alert, oriented and cooperative. Patient is in no acute distress.  Skin: Skin is warm and dry. No rash noted.   Cardiovascular: Normal heart rate noted  Respiratory: Normal respiratory effort, no problems with respiration noted  Abdomen: Soft, gravid, appropriate for gestational age.  Pain/Pressure: Absent     Pelvic: Cervical exam deferred        Extremities: Normal range of motion.  Edema: Trace  Mental Status: Normal mood and affect. Normal behavior. Normal judgment and thought content.   Assessment and Plan:  Pregnancy: G4P3003 at [redacted]w[redacted]d 1. [redacted] weeks gestation of pregnancy (Primary) S/p normal 28wk labs 6/3: 34%, 1216g , ac45 %, afi 14  and rpt PRN. Placenta still wnl Pt is self pay. Desires BTL d/w her re: applying for [facility] card and [facility] options, vasectomy clinic  2. Language barrier In person interpreter  used  Preterm labor symptoms and general obstetric precautions including but not limited to vaginal bleeding, contractions, leaking of fluid and fetal movement were reviewed in detail with the patient. Please refer to After Visit Summary for other counseling recommendations.   Return in about 3 weeks (around 11/26/2023) for low risk ob, in person, md or app.  Future Appointments  Date Time Provider Department Center  11/19/2023 10:15 AM Zelma Hidden, FNP [facility]  12/03/2023 10:15 AM Noreene Bearded, [place] [facility]  12/17/2023 10:15 AM Noreene Bearded, PA [facility]    Raynell Caller, MD

## 2023-11-19 ENCOUNTER — Other Ambulatory Visit: Payer: Self-pay

## 2023-11-19 ENCOUNTER — Ambulatory Visit (INDEPENDENT_AMBULATORY_CARE_PROVIDER_SITE_OTHER): Payer: Self-pay | Admitting: Obstetrics and Gynecology

## 2023-11-19 VITALS — BP 121/73 | HR 94 | Wt 174.0 lb

## 2023-11-19 DIAGNOSIS — Z3483 Encounter for supervision of other normal pregnancy, third trimester: Secondary | ICD-10-CM

## 2023-11-19 DIAGNOSIS — Z3A31 31 weeks gestation of pregnancy: Secondary | ICD-10-CM

## 2023-11-19 DIAGNOSIS — Z758 Other problems related to medical facilities and other health care: Secondary | ICD-10-CM

## 2023-11-19 DIAGNOSIS — Z3480 Encounter for supervision of other normal pregnancy, unspecified trimester: Secondary | ICD-10-CM

## 2023-11-19 DIAGNOSIS — Z603 Acculturation difficulty: Secondary | ICD-10-CM

## 2023-11-19 NOTE — Progress Notes (Unsigned)
   PRENATAL VISIT NOTE  Subjective:  Lisa Massey is a 33 y.o. [phone] at [redacted]w[redacted]d being seen today for ongoing prenatal care.  She is currently monitored for the following issues for this low-risk pregnancy and has Language barrier; Supervision of other normal pregnancy, antepartum; UTI in pregnancy, antepartum, first trimester; Late prenatal care; History of postpartum hemorrhage, currently pregnant; BMI 37.0-37.9, adult - pregravid; Obesity in pregnancy; and Low lying placenta, antepartum (resolved) on their problem list.  Patient reports no complaints.  Contractions: Not present. Vag. Bleeding: None.  Movement: Present. Denies leaking of fluid.   The following portions of the patient's history were reviewed and updated as appropriate: allergies, current medications, past family history, past medical history, past social history, past surgical history and problem list.   Objective:    Vitals:   11/19/23 1022  BP: 121/73  Pulse: 94  Weight: 174 lb (78.9 kg)    Fetal Status:  Fetal Heart Rate (bpm): 140 Fundal Height: 32 cm Movement: Present    General: Alert, oriented and cooperative. Patient is in no acute distress.  Skin: Skin is warm and dry. No rash noted.   Cardiovascular: Normal heart rate noted  Respiratory: Normal respiratory effort, no problems with respiration noted  Abdomen: Soft, gravid, appropriate for gestational age.  Pain/Pressure: Present     Pelvic: Cervical exam deferred        Extremities: Normal range of motion.  Edema: Trace (bilateral feet)  Mental Status: Normal mood and affect. Normal behavior. Normal judgment and thought content.   Assessment and Plan:  Pregnancy: G4P3003 at [redacted]w[redacted]d 1. Supervision of other normal pregnancy, antepartum (Primary) BP and FHR normal Doing well, feeling regular movement    2. [redacted] weeks gestation of pregnancy Asked about tdap vaccine and offered [facility], she reports she received the injection here the visit before her glucose  test, she recalls it was the injection for whooping cough.   3. Language barrier In person interpreter present    Preterm labor symptoms and general obstetric precautions including but not limited to vaginal bleeding, contractions, leaking of fluid and fetal movement were reviewed in detail with the patient. Please refer to After Visit Summary for other counseling recommendations.   Return in one week for routine prenatal   Future Appointments  Date Time Provider Department Center  12/03/2023 10:15 AM Wallace Joesph LABOR, [place] [facility]  12/17/2023 10:15 AM Wallace Joesph LABOR, [place] [facility]    Nidia Daring, FNP

## 2023-12-03 ENCOUNTER — Ambulatory Visit: Payer: Self-pay | Admitting: Family Medicine

## 2023-12-03 ENCOUNTER — Encounter: Payer: Self-pay | Admitting: Family Medicine

## 2023-12-03 ENCOUNTER — Other Ambulatory Visit: Payer: Self-pay

## 2023-12-03 VITALS — BP 113/69 | HR 103 | Wt 193.6 lb

## 2023-12-03 DIAGNOSIS — Z3A33 33 weeks gestation of pregnancy: Secondary | ICD-10-CM

## 2023-12-03 DIAGNOSIS — E669 Obesity, unspecified: Secondary | ICD-10-CM

## 2023-12-03 DIAGNOSIS — Z603 Acculturation difficulty: Secondary | ICD-10-CM

## 2023-12-03 DIAGNOSIS — O26893 Other specified pregnancy related conditions, third trimester: Secondary | ICD-10-CM

## 2023-12-03 DIAGNOSIS — R5383 Other fatigue: Secondary | ICD-10-CM

## 2023-12-03 DIAGNOSIS — Z758 Other problems related to medical facilities and other health care: Secondary | ICD-10-CM

## 2023-12-03 DIAGNOSIS — O99213 Obesity complicating pregnancy, third trimester: Secondary | ICD-10-CM

## 2023-12-03 DIAGNOSIS — Z3480 Encounter for supervision of other normal pregnancy, unspecified trimester: Secondary | ICD-10-CM

## 2023-12-03 DIAGNOSIS — O99210 Obesity complicating pregnancy, unspecified trimester: Secondary | ICD-10-CM

## 2023-12-03 LAB — CBC
Hematocrit: 35.4 % (ref 34.0–46.6)
Hemoglobin: 11.5 g/dL (ref 11.1–15.9)
MCH: 27.7 pg (ref 26.6–33.0)
MCHC: 32.5 g/dL (ref 31.5–35.7)
MCV: 85 fL (ref 79–97)
Platelets: 246 x10E3/uL (ref 150–450)
RBC: 4.15 x10E6/uL (ref 3.77–5.28)
RDW: 13.2 % (ref 11.7–15.4)
WBC: 7.2 x10E3/uL (ref 3.4–10.8)

## 2023-12-03 NOTE — Addendum Note (Signed)
 Addended by: ROSALYNN SHARLET RAMAN on: 12/03/2023 10:49 AM   Modules accepted: Orders

## 2023-12-03 NOTE — Patient Instructions (Addendum)
 Puedes probar un suplemento de magnesio para dormir.   Razones para volver a MAU/[facility]: Las contracciones son cada 5 minutos o menos, cada uno de los ltimos 1 minuto, stos han llevado a cabo durante 1-2 horas, y no se Hydrographic surveyor o hablar durante ellas Usted tiene un gran chorro de lquido o un goteo de lquido que no se detiene y hay que usar una toalla Ha de sangrado que es de color rojo brillante, denso que el manchado - como sangrado menstrual (manchado puede ser normal en trabajo de parto prematuro o despus de una comprobacin de su cuello uterino) Usted no se siente el beb se mueve como l / ella hace normalmente

## 2023-12-03 NOTE — Progress Notes (Signed)
   PRENATAL VISIT NOTE  Subjective:  Lisa Massey is a 33 y.o. [phone] at [redacted]w[redacted]d being seen today for ongoing prenatal care.  She is currently monitored for the following issues for this low-risk pregnancy and has Language barrier; Supervision of other normal pregnancy, antepartum; Late prenatal care; History of postpartum hemorrhage, currently pregnant; BMI 37.0-37.9, adult - pregravid; Obesity in pregnancy; and Low lying placenta, antepartum (resolved) on their problem list.  Patient reports fatigue.  Contractions: Not present. Vag. Bleeding: None.  Movement: Present. Denies leaking of fluid.   The following portions of the patient's history were reviewed and updated as appropriate: allergies, current medications, past family history, past medical history, past social history, past surgical history and problem list.   Objective:    Vitals:   12/03/23 1018  BP: 113/69  Pulse: (!) 103  Weight: 193 lb 9.6 oz (87.8 kg)    Fetal Status:  Fetal Heart Rate (bpm): 140 Fundal Height: 34 cm Movement: Present    General: Alert, oriented and cooperative. Patient is in no acute distress.  Skin: Skin is warm and dry. No rash noted.   Cardiovascular: Normal heart rate noted  Respiratory: Normal respiratory effort, no problems with respiration noted  Abdomen: Soft, gravid, appropriate for gestational age.  Pain/Pressure: Present     Pelvic: Cervical exam deferred        Extremities: Normal range of motion.  Edema: Trace  Mental Status: Normal mood and affect. Normal behavior. Normal judgment and thought content.   Assessment and Plan:  Pregnancy: G4P3003 at [redacted]w[redacted]d 1. Supervision of other normal pregnancy, antepartum (Primary) Prenatal course reviewed BP, HR, FHR within normal limits Feeling regular FM  CBC and iron studies for fatigue Recommend magnesium supplement for improved sleep  2. Obesity in pregnancy Continue mindful food choices and staying active.  3. Language barrier Due to  language barrier, a virtual interpreter was present during the history-taking, physical exam, and subsequent discussion with this patient.    4. [redacted] weeks gestation of pregnancy Fundal height appropriate for gestational age.    Preterm labor symptoms and general obstetric precautions including but not limited to vaginal bleeding, contractions, leaking of fluid and fetal movement were reviewed in detail with the patient. Please refer to After Visit Summary for other counseling recommendations.   Return in about 2 weeks (around 12/17/2023) for LOB.  Future Appointments  Date Time Provider Department Center  12/17/2023 10:15 AM Wallace Joesph LABOR, [place] [facility]    Joesph LABOR Wallace, [place]

## 2023-12-04 ENCOUNTER — Ambulatory Visit: Payer: Self-pay | Admitting: Family Medicine

## 2023-12-04 LAB — IRON AND TIBC
Iron Saturation: 9 % — CL (ref 15–55)
Iron: 47 ug/dL (ref 27–159)
Total Iron Binding Capacity: 495 ug/dL — ABNORMAL HIGH (ref 250–450)
UIBC: 448 ug/dL — ABNORMAL HIGH (ref 131–425)

## 2023-12-04 LAB — FERRITIN: Ferritin: 14 ng/mL — ABNORMAL LOW (ref 15–150)

## 2023-12-04 NOTE — Telephone Encounter (Addendum)
 Called patient with in house Spanish [place] interpreter regarding physician's advised of taking Blood Builder and iron supplements. Patient reports she has access to Mychart and will review physician's message. All questions address and denies any other needs at this time.    [name], RN ----- Message from Joesph DELENA Sear sent at 12/04/2023 10:49 AM EDT ----- Please call to inform of low iron labs and to start oral iron supplements.   Hi Lisa Massey  Your labs came back and show that your hemoglobin was a little low. This is very common during pregnancy and is usually because iron is low. I would recommend we try to improve you hemoglobin level.  We know that improving your pre-delivery hemoglobin reduces your risk of a hemorrhage.   I would recommend you try a supplement called Blood Builder in addition to your prenatal vitamin. It is available via [employer] and also at stores like [employer]. It uses plant based iron and is less  prone to causing stomach and bowel upset. Here is a screen shot of the supplement.   Alternatively, you can start a regular iron supplement in addition to your prenatal vitamin. Either ferrous sulfate, ferrous gluconate, or ferrous fumarate. The recommendation is 100 mg every other  day. These can all be found over-the-counter.  For some people this can be constipating, so you can also add a daily stool softener (Miralax) if needed.  About 10% of people will have difficulty with  iron and it will cause GI upset. To reduce the chance of GI upset, take the supplement with food. Taking it at night so you sleep through potential side effects can also be helpful. If that does not  help, taking a liquid formulation with food can help cause less GI symptoms.  Please let me know any questions,  Joesph DELENA Sear, PA ----- Message ----- From: Interface, [facility] Lab Results In Sent: 12/03/2023   9:35 PM EDT To: Joesph DELENA Sear, PA

## 2023-12-13 ENCOUNTER — Encounter (HOSPITAL_COMMUNITY): Payer: Self-pay | Admitting: Obstetrics and Gynecology

## 2023-12-13 ENCOUNTER — Inpatient Hospital Stay (HOSPITAL_COMMUNITY)
Admission: AD | Admit: 2023-12-13 | Discharge: 2023-12-13 | Disposition: A | Discharge status: Home / Self Care | Payer: Self-pay | Attending: Obstetrics & Gynecology | Admitting: Obstetrics & Gynecology

## 2023-12-13 ENCOUNTER — Other Ambulatory Visit: Payer: Self-pay

## 2023-12-13 DIAGNOSIS — M545 Low back pain, unspecified: Secondary | ICD-10-CM | POA: Insufficient documentation

## 2023-12-13 DIAGNOSIS — O99891 Other specified diseases and conditions complicating pregnancy: Secondary | ICD-10-CM

## 2023-12-13 DIAGNOSIS — M549 Dorsalgia, unspecified: Secondary | ICD-10-CM

## 2023-12-13 DIAGNOSIS — Z3A34 34 weeks gestation of pregnancy: Secondary | ICD-10-CM

## 2023-12-13 HISTORY — DX: Other specified health status: Z78.9

## 2023-12-13 LAB — URINALYSIS, ROUTINE W REFLEX MICROSCOPIC
Bilirubin Urine: NEGATIVE
Glucose, UA: NEGATIVE mg/dL
Hgb urine dipstick: NEGATIVE
Ketones, ur: NEGATIVE mg/dL
Nitrite: NEGATIVE
Protein, ur: 30 mg/dL — AB
Specific Gravity, Urine: 1.005 (ref 1.005–1.030)
WBC, UA: >50 WBC/hpf (ref 0–5)
pH: 7 (ref 5.0–8.0)

## 2023-12-13 MED ORDER — TRAMADOL HCL 50 MG PO TABS: 50.0000 mg | ORAL_TABLET | Freq: Once | ORAL | Status: DC

## 2023-12-13 MED ORDER — CYCLOBENZAPRINE HCL 5 MG PO TABS
10.0000 mg | ORAL_TABLET | Freq: Once | ORAL | Status: AC
  Administered 2023-12-13: 10 mg via ORAL
  Filled 2023-12-13: qty 2

## 2023-12-13 MED ORDER — OXYCODONE-ACETAMINOPHEN 5-325 MG PO TABS
0.5000 | ORAL_TABLET | Freq: Once | ORAL | Status: AC
  Administered 2023-12-13: 0.5 via ORAL
  Filled 2023-12-13: qty 1

## 2023-12-13 NOTE — MAU Note (Signed)
 Lisa Massey is a 33 y.o. at [redacted]w[redacted]d here in MAU reporting: since yesterday having a lot of lower back pain that has been constant (stabbing pain). Denies VB or LOF. +FM - moving more. Denies taking any medication for pain.   LMP: NA Onset of complaint: yesterday  Pain score: 9 Vitals:   12/13/23 1946  BP: 138/79  Pulse: 86  Resp: 18  Temp: 98.9 F (37.2 C)  SpO2: 99%     FHT: 148  Lab orders placed from triage: UA

## 2023-12-13 NOTE — MAU Provider Note (Signed)
 Chief Complaint:  Back Pain   Event Date/Time   First Provider Initiated Contact with Patient 12/13/23 2026     Back Pain This is a new problem. The current episode started yesterday. The problem occurs constantly. The problem is unchanged. The quality of the pain is described as stabbing. The pain does not radiate. Pertinent negatives include no fever or pelvic pain.    HPI: Lisa Massey is a 33 y.o. [phone] at 13w5dwho presents to maternity admissions reporting stabbing back pain since yesterday.  Denies contractions.. She reports good fetal movement, denies LOF, vaginal bleeding, vaginal itching/burning, urinary symptoms, h/a, dizziness, n/v, diarrhea, constipation or fever/chills.  She denies headache, visual changes or RUQ abdominal pain.  RN Note: Ermal Brzozowski is a 33 y.o. at [redacted]w[redacted]d here in MAU reporting: since yesterday having a lot of lower back pain that has been constant (stabbing pain). Denies VB or LOF. +FM - moving more. Denies taking any medication for pain. onset of complaint: yesterday  Pain score: 9  Past Medical History: Past Medical History:  Diagnosis Date   Medical history non-contributory     Past obstetric history: OB History  Gravida Para Term Preterm AB Living  4 3 3   3   SAB IAB Ectopic Multiple Live Births     0 3    # Outcome Date GA Lbr Len/2nd Weight Sex Type Anes PTL Lv  4 Current           3 Term 12/17/18 [redacted]w[redacted]d 15:55 / 00:17 4110 g M Vag-Spont EPI  LIV     Birth Comments: WNL  2 Term 04/28/15 [redacted]w[redacted]d 10:20 / 00:50 3941 g F Vag-Spont None  LIV  1 Term 05/22/12 [redacted]w[redacted]d  4196 g M Vag-Spont EPI  LIV    Past Surgical History: Past Surgical History:  Procedure Laterality Date   APPENDECTOMY     nexplanon       inserted 11/2018 per patient    Family History: Family History  Problem Relation Age of Onset   Hypertension Father    Cancer Sister        ovarian   Diabetes Maternal Grandmother    Asthma Neg Hx    Heart disease Neg Hx    Stroke  Neg Hx     Social History: Social History   Tobacco Use   Smoking status: Never   Smokeless tobacco: Never  Vaping Use   Vaping status: Never Used  Substance Use Topics   Alcohol use: No   Drug use: No    Allergies: No Known Allergies  Meds:  Medications Prior to Admission  Medication Sig Dispense Refill Last Dose/Taking   Prenatal Vit-Fe Fumarate-FA (MULTIVITAMIN-PRENATAL) 27-0.8 MG TABS tablet Take 1 tablet by mouth daily at 12 noon.   12/13/2023   cetirizine  (ZYRTEC  ALLERGY) 10 MG tablet Take 1 tablet (10 mg total) by mouth daily. 30 tablet 3     I have reviewed patient's Past Medical Hx, Surgical Hx, Family Hx, Social Hx, medications and allergies.   ROS:  Review of Systems  Constitutional:  Negative for activity change, chills, fatigue and fever.  Respiratory:  Negative for chest tightness and shortness of breath.   Genitourinary:  Negative for pelvic pain.  Musculoskeletal:  Positive for back pain.   Other systems negative  Physical Exam  Patient Vitals for the past 24 hrs:  BP Temp Temp src Pulse Resp SpO2 Height Weight  12/13/23 2003 133/78 -- -- 94 -- 99 % -- --  12/13/23  1946 138/79 98.9 F (37.2 C) Oral 86 18 99 % 5' 2.5 (1.588 m) 90.2 kg   Constitutional: Well-developed, well-nourished female in no acute distress.  Cardiovascular: normal rate  Respiratory: normal effort,  GI: Abd soft, non-tender, gravid appropriate for gestational age.   No rebound or guarding. MS: Extremities nontender, no edema, normal ROM  Lumbar area tender to palpation.  Neurologic: Alert and oriented x 4.   PELVIC EXAM: deferred   FHT:  Baseline 140 , moderate variability, accelerations present, no decelerations Contractions: Occasional   Labs: Results for orders placed or performed during the hospital encounter of 12/13/23 (from the past 24 hours)  Urinalysis, Routine w reflex microscopic -Urine, Clean Catch     Status: Abnormal   Collection Time: 12/13/23  7:49 PM   Result Value Ref Range   Color, Urine YELLOW YELLOW   APPearance HAZY (A) CLEAR   Specific Gravity, Urine 1.005 1.005 - 1.030   pH 7.0 5.0 - 8.0   Glucose, UA NEGATIVE NEGATIVE mg/dL   Hgb urine dipstick NEGATIVE NEGATIVE   Bilirubin Urine NEGATIVE NEGATIVE   Ketones, ur NEGATIVE NEGATIVE mg/dL   Protein, ur 30 (A) NEGATIVE mg/dL   Nitrite NEGATIVE NEGATIVE   Leukocytes,Ua LARGE (A) NEGATIVE   RBC / HPF 0-5 0 - 5 RBC/hpf   WBC, UA >50 0 - 5 WBC/hpf   Bacteria, UA RARE (A) NONE SEEN   Squamous Epithelial / HPF 0-5 0 - 5 /HPF   Mucus PRESENT    B/Positive/-- (02/26 1524)  Imaging:  No results found.  MAU Course/MDM: I have reviewed the triage vital signs and the nursing notes.   Pertinent labs & imaging results that were available during my care of the patient were reviewed by me and considered in my medical decision making (see chart for details).      I have reviewed her medical records including past results, notes and treatments.   I have ordered labs and reviewed results.  NST reviewed Treatments in MAU included Flexeril  5mg  and 1/2 tablet of Percocet 5/325.  This provided significant relief of pain. .    Assessment: Single IUP at [redacted]w[redacted]d Back pain in pregnancy  Plan: Discharge home Labor precautions and fetal kick counts Follow up in Office for prenatal visits and recheck Encouraged to return if she develops worsening of symptoms, increase in pain, fever, or other concerning symptoms.   Pt stable at time of discharge.  Earnie Pouch CNM, MSN Certified Nurse-Midwife 12/13/2023 8:26 PM

## 2023-12-14 ENCOUNTER — Inpatient Hospital Stay (HOSPITAL_COMMUNITY)
Admission: AD | Admit: 2023-12-14 | Discharge: 2023-12-15 | Disposition: A | Discharge status: Home / Self Care | Payer: Self-pay | Attending: Obstetrics & Gynecology | Admitting: Obstetrics & Gynecology

## 2023-12-14 ENCOUNTER — Encounter (HOSPITAL_COMMUNITY): Payer: Self-pay | Admitting: Obstetrics & Gynecology

## 2023-12-14 DIAGNOSIS — R519 Headache, unspecified: Secondary | ICD-10-CM | POA: Insufficient documentation

## 2023-12-14 DIAGNOSIS — O26893 Other specified pregnancy related conditions, third trimester: Secondary | ICD-10-CM | POA: Insufficient documentation

## 2023-12-14 DIAGNOSIS — O212 Late vomiting of pregnancy: Secondary | ICD-10-CM | POA: Insufficient documentation

## 2023-12-14 DIAGNOSIS — Z0371 Encounter for suspected problem with amniotic cavity and membrane ruled out: Secondary | ICD-10-CM

## 2023-12-14 DIAGNOSIS — Z3689 Encounter for other specified antenatal screening: Secondary | ICD-10-CM | POA: Insufficient documentation

## 2023-12-14 DIAGNOSIS — Z3A34 34 weeks gestation of pregnancy: Secondary | ICD-10-CM | POA: Insufficient documentation

## 2023-12-14 DIAGNOSIS — R52 Pain, unspecified: Secondary | ICD-10-CM

## 2023-12-14 DIAGNOSIS — Z3A35 35 weeks gestation of pregnancy: Secondary | ICD-10-CM

## 2023-12-14 DIAGNOSIS — R109 Unspecified abdominal pain: Secondary | ICD-10-CM | POA: Insufficient documentation

## 2023-12-14 LAB — URINALYSIS, ROUTINE W REFLEX MICROSCOPIC
Bilirubin Urine: NEGATIVE
Glucose, UA: NEGATIVE mg/dL
Ketones, ur: NEGATIVE mg/dL
Nitrite: NEGATIVE
Protein, ur: 100 mg/dL — AB
Specific Gravity, Urine: 1.009 (ref 1.005–1.030)
WBC, UA: >50 WBC/hpf (ref 0–5)
pH: 7 (ref 5.0–8.0)

## 2023-12-14 LAB — WET PREP, GENITAL
Clue Cells Wet Prep HPF POC: NONE SEEN
Sperm: NONE SEEN
Trich, Wet Prep: NONE SEEN
WBC, Wet Prep HPF POC: >=10 — AB (ref <10)
Yeast Wet Prep HPF POC: NONE SEEN

## 2023-12-14 MED ORDER — ACETAMINOPHEN-CAFFEINE 500-65 MG PO TABS
2.0000 | ORAL_TABLET | Freq: Once | ORAL | Status: AC
  Administered 2023-12-14: 2 via ORAL
  Filled 2023-12-14: qty 2

## 2023-12-14 NOTE — MAU Note (Signed)
..  Lisa Massey is a 33 y.o. at [redacted]w[redacted]d here in MAU reporting:  At 1300 vomited once and felt a gush of fluid, not sure if it was urine or amniotic fluid. Reports that she has not leaked again since that initial leak.  Body aches that began today.  Abdominal tightening that began yesterday, and has not felt baby move all evening because of this.  Denies vaginal bleeding.   Pain score:  Body aches 10/10 Abdominal tightening 9/10 Vitals:   12/14/23 2224  BP: 128/66  Pulse: (!) 107  Resp: 20  Temp: 99.9 F (37.7 C)  SpO2: 100%     FHT:165 Lab orders placed from triage:  UA

## 2023-12-14 NOTE — MAU Provider Note (Signed)
 History     CSN: [id number]  Arrival date and time: 12/14/23 2146   Event Date/Time   First Provider Initiated Contact with Patient 12/14/23 2234      Chief Complaint  Patient presents with   Emesis   Rupture of Membranes   Abdominal Pain   Generalized Body Aches   Lisa Massey is a 33 y.o. [id number] at [redacted]w[redacted]d who receives care at [facility].  She presents today for vomiting, body aches, and ROM.  She reports vomiting at 1300 and is also having body pain.  She denies sick individuals in the home and does not work outside the home.  She reports she has not taken anything for her symptoms.  She endorses fetal movement, but reports some abdominal tightening after vomiting.  She states around 1330 she was standing and felt fluid coming out.  She denies other incidents and denies sexual activity in the past 3 days. However, she does report yellow vaginal discharge, but denies itching, odor, or irritation.    OB History     Gravida  4   Para  3   Term  3   Preterm      AB      Living  3      SAB      IAB      Ectopic      Multiple  0   Live Births  3           Past Medical History:  Diagnosis Date   Medical history non-contributory     Past Surgical History:  Procedure Laterality Date   APPENDECTOMY     nexplanon       inserted 11/2018 per patient    Family History  Problem Relation Age of Onset   Hypertension Father    Cancer Sister        ovarian   Diabetes Maternal Grandmother    Asthma Neg Hx    Heart disease Neg Hx    Stroke Neg Hx     Social History   Tobacco Use   Smoking status: Never   Smokeless tobacco: Never  Vaping Use   Vaping status: Never Used  Substance Use Topics   Alcohol use: No   Drug use: No    Allergies: No Known Allergies  Medications Prior to Admission  Medication Sig Dispense Refill Last Dose/Taking   cetirizine  (ZYRTEC  ALLERGY) 10 MG tablet Take 1 tablet (10 mg total) by mouth daily. 30 tablet 3 12/13/2023    Prenatal Vit-Fe Fumarate-FA (MULTIVITAMIN-PRENATAL) 27-0.8 MG TABS tablet Take 1 tablet by mouth daily at 12 noon.   12/13/2023    Review of Systems  Constitutional:  Positive for chills. Negative for fever.  HENT:  Negative for congestion, sinus pressure, sinus pain and sore throat.   Respiratory:  Negative for cough.   Gastrointestinal:  Positive for abdominal pain (Tightening after vomiting.), constipation and vomiting (X1). Negative for diarrhea and nausea.  Genitourinary:  Positive for pelvic pain (Strong pain with walking or sitting) and vaginal discharge. Negative for difficulty urinating, dysuria and vaginal bleeding.  Neurological:  Positive for headaches (9/10-top of head. Soreness). Negative for dizziness and light-headedness.   Physical Exam   Blood pressure 128/66, pulse (!) 107, temperature 99.9 F (37.7 C), temperature source Oral, resp. rate 20, height 5' 3 (1.6 m), weight 89.4 kg, last menstrual period 03/18/2023, SpO2 100%.  Physical Exam Vitals and nursing note reviewed. Exam conducted with a chaperone present.  Constitutional:  General: She is not in acute distress.    Appearance: She is well-developed. She is not ill-appearing.  HENT:     Head: Normocephalic and atraumatic.  Cardiovascular:     Rate and Rhythm: Normal rate and regular rhythm.  Pulmonary:     Effort: Pulmonary effort is normal. No respiratory distress.     Breath sounds: Normal breath sounds.  Abdominal:     Palpations: Abdomen is soft.     Tenderness: There is no abdominal tenderness.  Genitourinary:    Vagina: Normal.     Comments: Speculum Exam: -Normal External Genitalia: Non tender, no apparent discharge or fluid at introitus.  -Vaginal Vault: Pink mucosa with good rugae. Negative pooling.  Small amt thick yellow discharge -wet prep collected -Cervix:Pink, no lesions, cysts, or polyps.  Appears slightly open. No active bleeding from os. -Bimanual Exam: Dilation: 1 Effacement (%):  Thick Exam by:: Harlene Duncans CNM   Skin:    General: Skin is warm and dry.  Neurological:     Mental Status: She is alert and oriented to person, place, and time.  Psychiatric:        Mood and Affect: Mood normal.        Behavior: Behavior normal.     Fetal Assessment 155 bpm, Mod Var, -Decels, +Accels Toco: Q3-51min  MAU Course   Results for orders placed or performed during the hospital encounter of 12/14/23 (from the past 24 hours)  Urinalysis, Routine w reflex microscopic -Urine, Clean Catch     Status: Abnormal   Collection Time: 12/14/23 10:53 PM  Result Value Ref Range   Color, Urine YELLOW YELLOW   APPearance CLOUDY (A) CLEAR   Specific Gravity, Urine 1.009 1.005 - 1.030   pH 7.0 5.0 - 8.0   Glucose, UA NEGATIVE NEGATIVE mg/dL   Hgb urine dipstick SMALL (A) NEGATIVE   Bilirubin Urine NEGATIVE NEGATIVE   Ketones, ur NEGATIVE NEGATIVE mg/dL   Protein, ur 899 (A) NEGATIVE mg/dL   Nitrite NEGATIVE NEGATIVE   Leukocytes,Ua LARGE (A) NEGATIVE   RBC / HPF 21-50 0 - 5 RBC/hpf   WBC, UA >50 0 - 5 WBC/hpf   Bacteria, UA MANY (A) NONE SEEN   Squamous Epithelial / HPF 11-20 0 - 5 /HPF   WBC Clumps PRESENT    Mucus PRESENT   Resp panel by RT-PCR (RSV, Flu A&B, Covid) Anterior Nasal Swab     Status: None   Collection Time: 12/14/23 10:53 PM   Specimen: Anterior Nasal Swab  Result Value Ref Range   SARS Coronavirus 2 by RT PCR NEGATIVE NEGATIVE   Influenza A by PCR NEGATIVE NEGATIVE   Influenza B by PCR NEGATIVE NEGATIVE   Resp Syncytial Virus by PCR NEGATIVE NEGATIVE  Wet prep, genital     Status: Abnormal   Collection Time: 12/14/23 11:10 PM  Result Value Ref Range   Yeast Wet Prep HPF POC NONE SEEN NONE SEEN   Trich, Wet Prep NONE SEEN NONE SEEN   Clue Cells Wet Prep HPF POC NONE SEEN NONE SEEN   WBC, Wet Prep HPF POC >=10 (A) <10   Sperm NONE SEEN   CBC with Differential/Platelet     Status: Abnormal   Collection Time: 12/14/23 11:41 PM  Result Value Ref Range    WBC 14.0 (H) 4.0 - 10.5 K/uL   RBC 4.15 3.87 - 5.11 MIL/uL   Hemoglobin 11.4 (L) 12.0 - 15.0 g/dL   HCT 66.2 (L) 63.9 - 53.9 %  MCV 81.2 80.0 - 100.0 fL   MCH 27.5 26.0 - 34.0 pg   MCHC 33.8 30.0 - 36.0 g/dL   RDW 86.0 88.4 - 84.4 %   Platelets 210 150 - 400 K/uL   nRBC 0.0 0.0 - 0.2 %   Neutrophils Relative % 85 %   Neutro Abs 12.0 (H) 1.7 - 7.7 K/uL   Lymphocytes Relative 7 %   Lymphs Abs 1.0 0.7 - 4.0 K/uL   Monocytes Relative 7 %   Monocytes Absolute 1.0 0.1 - 1.0 K/uL   Eosinophils Relative 0 %   Eosinophils Absolute 0.0 0.0 - 0.5 K/uL   Basophils Relative 0 %   Basophils Absolute 0.0 0.0 - 0.1 K/uL   Immature Granulocytes 1 %   Abs Immature Granulocytes 0.07 0.00 - 0.07 K/uL   No results found.   MDM PE Labs: UA, Covid/Flu, CBC/D, BMP, UC EFM Wet Prep Analgesic Interpreter Assessment and Plan   33 year old G4P3003  SIUP at 34.6 weeks Cat I FT Generalized Body Aches Headache  -POC discussed.  -Exam performed. -Patient offered and accepts pain medication. -Excedrin migraine ordered. -Discussed speculum exam and wet prep for c/o vaginal leaking x 1.  -Wet prep obtained and pending. -Send Covid/Flu  -NST reactive. -Monitor and await results.  -Interpretations including obtaining HPI, ROS, and review of POC completed with assistance of in-person interpreter: Hadassah.  Harlene LITTIE Duncans MSN, CNM 12/14/2023, 10:34 PM   Reassessment (1:38 AM) -Results return as above. -Elevated white count, but covid and flu negative. -Patient reports improvement in symptoms with medication. Rates HA now 6/10 and manageable.  -UA with many bacteria and will send for culture.  -Encouraged rest and hydration at home. -Precautions reviewed.  -Discharged to home in stable condition. -Interpretations including reviewing of results, reassessment of pain, and precautions completed with assistance of video interpreter: Bette [phone].

## 2023-12-15 DIAGNOSIS — O26893 Other specified pregnancy related conditions, third trimester: Secondary | ICD-10-CM

## 2023-12-15 DIAGNOSIS — R52 Pain, unspecified: Secondary | ICD-10-CM

## 2023-12-15 DIAGNOSIS — Z3A34 34 weeks gestation of pregnancy: Secondary | ICD-10-CM

## 2023-12-15 LAB — RESP PANEL BY RT-PCR (RSV, FLU A&B, COVID)  RVPGX2
Influenza A by PCR: NEGATIVE
Influenza B by PCR: NEGATIVE
Resp Syncytial Virus by PCR: NEGATIVE
SARS Coronavirus 2 by RT PCR: NEGATIVE

## 2023-12-15 LAB — BASIC METABOLIC PANEL WITH GFR
Anion gap: 9 (ref 5–15)
BUN: <5 mg/dL — ABNORMAL LOW (ref 6–20)
CO2: 18 mmol/L — ABNORMAL LOW (ref 22–32)
Calcium: 8.9 mg/dL (ref 8.9–10.3)
Chloride: 105 mmol/L (ref 98–111)
Creatinine, Ser: 0.47 mg/dL (ref 0.44–1.00)
GFR, Estimated: >60 mL/min (ref >60)
Glucose, Bld: 122 mg/dL — ABNORMAL HIGH (ref 70–99)
Potassium: 3.6 mmol/L (ref 3.5–5.1)
Sodium: 132 mmol/L — ABNORMAL LOW (ref 135–145)

## 2023-12-15 LAB — CBC WITH DIFFERENTIAL/PLATELET
Abs Immature Granulocytes: 0.07 K/uL (ref 0.00–0.07)
Basophils Absolute: 0 K/uL (ref 0.0–0.1)
Basophils Relative: 0 %
Eosinophils Absolute: 0 K/uL (ref 0.0–0.5)
Eosinophils Relative: 0 %
HCT: 33.7 % — ABNORMAL LOW (ref 36.0–46.0)
Hemoglobin: 11.4 g/dL — ABNORMAL LOW (ref 12.0–15.0)
Immature Granulocytes: 1 %
Lymphocytes Relative: 7 %
Lymphs Abs: 1 K/uL (ref 0.7–4.0)
MCH: 27.5 pg (ref 26.0–34.0)
MCHC: 33.8 g/dL (ref 30.0–36.0)
MCV: 81.2 fL (ref 80.0–100.0)
Monocytes Absolute: 1 K/uL (ref 0.1–1.0)
Monocytes Relative: 7 %
Neutro Abs: 12 K/uL — ABNORMAL HIGH (ref 1.7–7.7)
Neutrophils Relative %: 85 %
Platelets: 210 K/uL (ref 150–400)
RBC: 4.15 MIL/uL (ref 3.87–5.11)
RDW: 13.9 % (ref 11.5–15.5)
WBC: 14 K/uL — ABNORMAL HIGH (ref 4.0–10.5)
nRBC: 0 % (ref 0.0–0.2)

## 2023-12-17 ENCOUNTER — Ambulatory Visit: Payer: Self-pay

## 2023-12-17 ENCOUNTER — Encounter: Payer: Self-pay | Admitting: Family Medicine

## 2023-12-17 ENCOUNTER — Ambulatory Visit: Payer: Self-pay | Admitting: Family Medicine

## 2023-12-17 ENCOUNTER — Other Ambulatory Visit: Payer: Self-pay

## 2023-12-17 VITALS — BP 110/75 | HR 103 | Wt 197.6 lb

## 2023-12-17 DIAGNOSIS — R52 Pain, unspecified: Secondary | ICD-10-CM

## 2023-12-17 DIAGNOSIS — Z603 Acculturation difficulty: Secondary | ICD-10-CM

## 2023-12-17 DIAGNOSIS — Z3480 Encounter for supervision of other normal pregnancy, unspecified trimester: Secondary | ICD-10-CM

## 2023-12-17 DIAGNOSIS — Z758 Other problems related to medical facilities and other health care: Secondary | ICD-10-CM

## 2023-12-17 DIAGNOSIS — B9620 Unspecified Escherichia coli [E. coli] as the cause of diseases classified elsewhere: Secondary | ICD-10-CM | POA: Insufficient documentation

## 2023-12-17 DIAGNOSIS — O99213 Obesity complicating pregnancy, third trimester: Secondary | ICD-10-CM

## 2023-12-17 DIAGNOSIS — O99210 Obesity complicating pregnancy, unspecified trimester: Secondary | ICD-10-CM

## 2023-12-17 DIAGNOSIS — Z3A35 35 weeks gestation of pregnancy: Secondary | ICD-10-CM

## 2023-12-17 LAB — CULTURE, OB URINE: Culture: >=100000 — AB

## 2023-12-17 MED ORDER — CEFADROXIL 500 MG PO CAPS: 500.0000 mg | ORAL_CAPSULE | Freq: Two times a day (BID) | ORAL | 0 refills | Status: DC

## 2023-12-17 MED ORDER — CYCLOBENZAPRINE HCL 10 MG PO TABS: 10.0000 mg | ORAL_TABLET | Freq: Three times a day (TID) | ORAL | 1 refills | Status: DC | PRN

## 2023-12-17 NOTE — Patient Instructions (Signed)
 ADULT OTC PAIN MEDICATIONS: Acetaminophen  (Tylenol ) - Immediate-release: 325 mg to 1000 mg (1 g) orally every 4 to 6 hours - Minimum Dosing Interval: every 4 hours - Maximum Single Dose: 1000 mg - Maximum Dose: 4000 mg per 24 hours  -DO NOT TAKE IF DRINKING ALCOHOL

## 2023-12-17 NOTE — Progress Notes (Signed)
   PRENATAL VISIT NOTE  Subjective:  Lisa Massey is a 33 y.o. [phone] at [redacted]w[redacted]d being seen today for ongoing prenatal care.  She is currently monitored for the following issues for this low-risk pregnancy and has Language barrier; Supervision of other normal pregnancy, antepartum; Late prenatal care; History of postpartum hemorrhage, currently pregnant; BMI 37.0-37.9, adult - pregravid; Obesity in pregnancy; and Low lying placenta, antepartum (resolved) on their problem list.  Patient reports ongoing body aches. Seen in MAU 12/14/2023 for vomiting and body aches, COVID and flu negative with recommendations for supportive care.  Contractions: Irritability. Vag. Bleeding: None.  Movement: Present. Denies leaking of fluid.   The following portions of the patient's history were reviewed and updated as appropriate: allergies, current medications, past family history, past medical history, past social history, past surgical history and problem list.   Objective:    Vitals:   12/17/23 1026  BP: 110/75  Pulse: (!) 103  Weight: 197 lb 9.6 oz (89.6 kg)    Fetal Status:  Fetal Heart Rate (bpm): 126 Fundal Height: 36 cm Movement: Present    General: Alert, oriented and cooperative. Patient is in no acute distress.  Skin: Skin is warm and dry. No rash noted.   Cardiovascular: Normal heart rate noted  Respiratory: Normal respiratory effort, no problems with respiration noted  Abdomen: Soft, gravid, appropriate for gestational age.  Pain/Pressure: Present     Pelvic: Cervical exam deferred        Extremities: Normal range of motion.  Edema: Trace  Mental Status: Normal mood and affect. Normal behavior. Normal judgment and thought content.   Assessment and Plan:  Pregnancy: G4P3003 at [redacted]w[redacted]d 1. Supervision of other normal pregnancy, antepartum (Primary) Prenatal course reviewed BP, HR, FHR within normal limits Feeling regular FM   2. Obesity in pregnancy Continue mindful food choices and  staying active.  3. Language barrier Due to language barrier, an in-person interpreter was present during the history-taking, physical exam, and subsequent discussion with this patient.    4. [redacted] weeks gestation of pregnancy Discussed anticipatory guidance for GBS and GC/CT swabs at next visit.  5. Generalized body aches Recommend Tylenol  and Flexeril  as needed. Continue increased hydration and good nutrition. Discussed likely due to viral illness.  Preterm labor symptoms and general obstetric precautions including but not limited to vaginal bleeding, contractions, leaking of fluid and fetal movement were reviewed in detail with the patient. Please refer to After Visit Summary for other counseling recommendations.   Return in about 1 week (around 12/24/2023) for LOB+GBS.  Future Appointments  Date Time Provider Department Center  12/24/2023  2:15 PM Ilean Norleen GAILS, MD [facility]  12/31/2023 11:15 AM Herchel Gloris LABOR, MD [facility]  01/07/2024  1:15 PM [name] LABOR ED[facility]  01/14/2024 10:55 AM [name], FNP [facility]    Joesph LABOR Sear, PA

## 2023-12-18 DIAGNOSIS — N12 Tubulo-interstitial nephritis, not specified as acute or chronic: Secondary | ICD-10-CM | POA: Insufficient documentation

## 2023-12-18 NOTE — Telephone Encounter (Addendum)
-----   Message from Harlene LITTIE Duncans sent at 12/17/2023  8:13 PM EDT ----- Please notify patient of positive UTI and treatment sent to pharmacy on file. Thank you ----- Message ----- From: Interface, Lab In [place] Sent: 12/16/2023   8:41 AM EDT To: Harlene Duncans, CNM  Left message with Spanish Interpreter, Shasta, that she has a UTI and an antibiotic has been sent to her [facility] pharmacy on [address] That she will take twice a day for seven days.  If she has questions to please give the office a call.    Imari Reen,RN  12/18/23

## 2023-12-19 NOTE — Procedures (Signed)
-------------------------------------------------------------------------------   Attestation signed by David Maryjane Lusty, MD at 12/19/2023  5:48 PM I reviewed the fetal heart tracing/non-stress test, and I agree with Dr. Delton impression and plan of care.   Talla M. Widelock, MD Section of Maternal-Fetal Medicine [facility]   -------------------------------------------------------------------------------  Non Stress Test Procedure Note  Patient: Lisa Massey Gestational Age: [redacted]w[redacted]d Date: 12/19/2023  Indication: Pyelo   FHT: Baseline HR 130, moderate variability, positive accelerations, negative decelerations   Tocometer: Single contraction  Impression: Reactive  Lisa Levonne Coventry, MD PGY-3 Obstetrics and Gynecology

## 2023-12-20 NOTE — Progress Notes (Signed)
    Division of Pharmacy Services  Medication History Completion Note  Name/DOB/Age of Patient: Lisa Massey / 07/11/1990 / 33 y.o.  Location: [facility] ANTE  Type: Hospital admission & Modality: Phone  Confirmed two patient identifiers: Yes  Confirmed patient is alert & oriented: Yes  Medication History Source (Med History Informants):  Patient Dispense History   Asked about any missing medications (such as pumps, injectable meds, TPN, OTC, etc): Yes  PTA Med List:  Prior to Admission Medications     Reviewed by Delon Helling Bell-Driscoll, CPhT on 12/20/23 at 1128    Medication Sig Last Dose Informant Taking? Status  acetaminophen  (TYLENOL ) 500 mg tablet Take 500 mg by mouth every 6 (six) hours as needed for mild pain (1-3).  Self, Other  Active  prenatal vitamin (VINATE) 60 mg iron-1 mg tab tablet Take 1 tablet by mouth daily. 12/17/2023 Self, Other Yes Active           Audit from Redirected Encounters   **Prior to Admission medications have not yet been reviewed for this encounter**     Selected Pharmacy:  [id number]  Comments: All medications and allergies were verified by patient/ interpreter Lawernce [phone] who is a good  historian and last fills/doses were verified by Dr. Annemarie. Electronically signed by: Delon Helling Bell-Driscoll, CPhT 12/20/2023 11:27 AM  DPS enrolled for delivery. Please call [phone] with questions for patients bedded in [facility] and [phone] for all other locations.   Medications reconciled by provider: No   Signature/Co-signature, if required: Delon Helling Bell-Driscoll, CPhT   Date/Time: 12/20/2023 11:29 AM

## 2023-12-21 ENCOUNTER — Encounter: Payer: Self-pay | Admitting: Obstetrics & Gynecology

## 2023-12-21 ENCOUNTER — Telehealth: Payer: Self-pay | Admitting: Obstetrics & Gynecology

## 2023-12-21 DIAGNOSIS — I82401 Acute embolism and thrombosis of unspecified deep veins of right lower extremity: Secondary | ICD-10-CM | POA: Insufficient documentation

## 2023-12-21 DIAGNOSIS — O2303 Infections of kidney in pregnancy, third trimester: Secondary | ICD-10-CM | POA: Insufficient documentation

## 2023-12-21 DIAGNOSIS — Z87448 Personal history of other diseases of urinary system: Secondary | ICD-10-CM | POA: Insufficient documentation

## 2023-12-21 NOTE — Telephone Encounter (Signed)
     Faculty Practice OB/GYN Physician Phone Call Documentation  I had a phone conversation with a physician at [facility] informing us  that Lisa Massey was being discharged after hospitalization for pyelonephritis, also was diagnosed with R DVT.  She was started on antibiotics, and Lovenox. Needs Anti-Xa level during her visit on 12/24/23.  Appointment notes edited, Dr. Cresenzo (the provider on 12/24/23) was also notified.   GLORIS HUGGER, MD, FACOG Obstetrician & Gynecologist, [facility] for [employer], [facility]

## 2023-12-21 NOTE — Discharge Summary (Signed)
 ------------------------------------------------------------------------------- Attestation signed by Lisa CHRISTELLA Hilts, MD at 12/21/2023  3:47 PM MFM Attending Attestation  I saw the patient and reviewed the findings and counseling with the OB/GYN Resident. I agree with the assessment and care plan as documented in the Resident's note.  Lisa Hilts, MD, MS  -------------------------------------------------------------------------------  Discharge Summary   Name: Lisa Massey Age: 33 y.o. MRN: [id number] DOB: June 27, 1990  Referring Clinic/Provider: [facility]   Admit date: 12/17/2023 Admitting Physician: Lisa Almarie Pizza, MD Admission Condition: Fair Admission Diagnoses:  See admission history and physical  Discharge date: 12/21/2023 Discharge Physician: Lisa Hilts, MD Discharged Condition: Good  Discharge Diagnoses and Overview:   Patient Active Problem List   Diagnosis Date Noted   . *Pyelonephritis 12/18/2023    Overview Note:    Presented on 7/21 fever and flank pain and was diagnosed with pyelonephritis Patient was started on ceftriaxone , however patient refevered and was broadened to Zosyn on hospital day 2.  On hospital day 3 patient was transition to oral Keflex  4 times daily after susceptibilities resulted (pan susceptible).  Antibiotic regimen: Keflex  500 mg 4 times daily x 14 days followed by Macrobid 100 mg daily for prophylaxis   . DVT (deep vein thrombosis) in pregnancy (CMD) 12/21/2023    Overview Note:    Patient with acute episode of shortness of breath and chest pain on hospital day 1 with positive right Lisa Massey' sign  Lower extremity Dopplers positive for DVT, started on weight-based therapeutic Lovenox dosing (90 mg twice daily)  AntiXa  level subtherapeutic and increased to 100 mg twice daily  Patient has follow-up visit on Monday at 1315, plan for anti-Xa level to be drawn at that time by primary OB        Resolved Problems  No resolved problems to display.     Surgeries/Procedures performed this admission: None  Hospital Course:  Lisa Massey is a 33 y.o. H5E6996 who was admitted at 35 weeks for pyelonephritis.   Patient initially presented to L&D triage and was diagnosed with pyelonephritis.  She was admitted to the antepartum service and started on 1 g ceftriaxone  IV every 24 hours.  Given poor p.o. intake patient was also started on maintenance IV fluids.  On hospital day 1-2 patient refevered and her antibiotics were broadened to Zosyn.  Urine culture resulted E. coli that was pan susceptible.  Patient was transition to oral Keflex  on hospital day 3.  Patient remained afebrile x 24 hours on Keflex .  She will right main on Keflex  500 mg 4 times daily for a total antibiotic course of 14 days, followed by 100 mg of Macrobid daily through delivery.  Of note on hospital day 1 patient had episode of acute chest pain shortness of breath, evaluation revealed positive Lisa Massey' sign on the right lower extremity.  Lower extremity Dopplers was positive for a right DVT and patient was started on weight-based therapeutic Lovenox dosing.  Anti-Xa level that was drawn on hospital day 3 was subtherapeutic and her dosing was increased to 100 mg twice daily.  Patient has follow-up with her primary OB on 7/28.  Recommend repeat anti-Xa at that time.  CT PE was negative for pulmonary embolism.  On hospital day 4 patient remained afebrile for over 48 hours and was tolerating oral antibiotics.  Patient was deemed stable for discharge with strict return precautions.  She received the following prescriptions as noted below and has follow-up on Monday 7/28 with her primary OB.  Physical Exam at  Discharge:  Temp:  [97.4 F (36.3 C)-98.8 F (37.1 C)] 97.5 F (36.4 C) Heart Rate:  [78-91] 86 Resp:  [17-20] 18 BP: (104-123)/(56-84) 122/84  GENERAL: Alert, No acute distress CHEST: No increased work of  breathing CV: Regular rate ABDOMEN: Soft, non-distended, gravid EXTREMITIES:  Warm and well-perfused, nontender, nonedematous NEURO: CN II-XII grossly intact  Consults:  N/A  Significant Diagnostic Studies:  CBC: Results from last 7 days  Lab Units 12/20/23 0726 12/18/23 2056 12/18/23 1425  WHITE BLOOD CELL COUNT 10*3/uL 6.30 8.70 10.00  HEMOGLOBIN g/dL 89.2* 89.1* 89.4*  PLATELET COUNT 10*3/uL 146* 155 162   BMP/CMP/CHEMISTRIES: Results from last 7 days  Lab Units 12/20/23 0726 12/18/23 1427 12/17/23 2305  SODIUM mmol/L 140 133* 132*  POTASSIUM mmol/L 3.6 3.7 3.4*  CHLORIDE mmol/L 108* 102 100  CO2 mmol/L 23 19* 18*  BUN mg/dL 8 8 10   CREATININE mg/dL 9.23 9.03 9.11  ANION GAP mmol/L 9 12 14   CALCIUM mg/dL 8.1* 8.0* 8.4*  TOTAL PROTEIN g/dL  --   --  5.6*  ALBUMIN g/dL  --   --  3.0*  BILIRUBIN TOTAL mg/dL  --   --  0.5  AST U/L  --   --  24  ALT U/L  --   --  14   Coags:    Invalid input(s): FIB Glucose:  Urine Studies:  Results from last 7 days  Lab Units 12/17/23 2305  COLOR UA  Yellow  NITRITE UA  Negative  BLOOD, URINE  2+*  BILIRUBIN UA  Negative  UROBILINOGEN, URINE mg/dL Normal  GLUCOSE, UA mg/dL Negative     Imaging: No orders to display    Disposition: Home  Patient Instructions: see AVS  Discharge Medications:    Medication List     START taking these medications    cephALEXin  500 mg capsule Commonly known as: KEFLEX  Tome 1 capsula por boca cada 6 horas para 11 dias (Take 1 capsule (500 mg total) by mouth every 6 (six) hours for 11 days.)   enoxaparin 100 mg/mL Syrg Commonly known as: LOVENOX Inyecte el contenido de 1 jeringa en la piel cada 12 (doce) horas. (Inject 1 mL (100 mg total) under the skin every 12 (twelve) hours.)   nitrofurantoin (macrocrystal-monohydrate) 100 mg capsule Commonly known as: MACROBID Tome 1 capsula por boca al dia. Empezar despues de completar keflex  y Energy manager del  embarazo (Take 1 capsule (100 mg total) by mouth daily. Start after completing Keflex , and continue through dleivery)       CONTINUE taking these medications    acetaminophen  500 mg tablet Commonly known as: TYLENOL  Take 500 mg by mouth every 6 (six) hours as needed for mild pain (1-3).   prenatal vitamin 60 mg iron-1 mg Tab tablet Commonly known as: VINATE Take 1 tablet by mouth daily.         Where to Get Your Medications     These medications were sent to [facility] [address] [place] [address]    Hours: Open Monday 12am to Friday 11:59pm; Sat-Sun: Closed; Holidays: Closed Thanksgiving Phone: [phone]  cephALEXin  500 mg capsule enoxaparin 100 mg/mL Syrg nitrofurantoin (macrocrystal-monohydrate) 100 mg capsule      Follow-up Appointments: F/u PNV with primary OB on 7/28  I have personally spent 25 minutes involved in face-to-face and non-face-to-face activities for this patient on the day of the visit.  Professional time spent includes the following activities, in addition  to those noted in the documentation:Preparing to see the patient on day of service, Obtaining and/or reviewing history, Performing medically appropriate exam, Counseling/education of patient, Ordering medications, test or procedure, Documenting visit, and Coordinating care  Maurilio Levonne Coventry, MD PGY-3 Obstetrics and Gynecology

## 2023-12-21 NOTE — Procedures (Signed)
-------------------------------------------------------------------------------   Attestation signed by Lisa CHRISTELLA Hilts, MD at 12/21/2023  3:48 PM MFM Attending Attestation  I reviewed the NST findings with the OB/GYN Resident. I agree with the assessment as documented in the Resident's note.  Lisa Hilts, MD, MS  -------------------------------------------------------------------------------  Non Stress Test Procedure Note  Patient: Lisa Massey Gestational Age: [redacted]w[redacted]d Date: 12/21/2023  Indication: Pyelo   FHT: Baseline HR 130, moderate variability, positive accelerations, negative decelerations   Tocometer: Irregular contractions  Impression: Reactive  Maurilio Levonne Coventry, MD PGY-3 Obstetrics and Gynecology

## 2023-12-24 ENCOUNTER — Other Ambulatory Visit: Payer: Self-pay

## 2023-12-24 ENCOUNTER — Ambulatory Visit: Payer: Self-pay | Admitting: Family Medicine

## 2023-12-24 ENCOUNTER — Other Ambulatory Visit (HOSPITAL_COMMUNITY)
Admission: RE | Admit: 2023-12-24 | Discharge: 2023-12-24 | Disposition: A | Discharge status: Home / Self Care | Payer: Self-pay | Source: Ambulatory Visit | Attending: Family Medicine | Admitting: Family Medicine

## 2023-12-24 VITALS — BP 131/85 | HR 71 | Wt 202.0 lb

## 2023-12-24 DIAGNOSIS — O4440 Low lying placenta NOS or without hemorrhage, unspecified trimester: Secondary | ICD-10-CM

## 2023-12-24 DIAGNOSIS — B9620 Unspecified Escherichia coli [E. coli] as the cause of diseases classified elsewhere: Secondary | ICD-10-CM

## 2023-12-24 DIAGNOSIS — Z3A36 36 weeks gestation of pregnancy: Secondary | ICD-10-CM

## 2023-12-24 DIAGNOSIS — Z758 Other problems related to medical facilities and other health care: Secondary | ICD-10-CM

## 2023-12-24 DIAGNOSIS — I82401 Acute embolism and thrombosis of unspecified deep veins of right lower extremity: Secondary | ICD-10-CM

## 2023-12-24 DIAGNOSIS — O99210 Obesity complicating pregnancy, unspecified trimester: Secondary | ICD-10-CM

## 2023-12-24 DIAGNOSIS — O4443 Low lying placenta NOS or without hemorrhage, third trimester: Secondary | ICD-10-CM

## 2023-12-24 DIAGNOSIS — Z603 Acculturation difficulty: Secondary | ICD-10-CM

## 2023-12-24 DIAGNOSIS — O99213 Obesity complicating pregnancy, third trimester: Secondary | ICD-10-CM

## 2023-12-24 DIAGNOSIS — N390 Urinary tract infection, site not specified: Secondary | ICD-10-CM

## 2023-12-24 DIAGNOSIS — Z3480 Encounter for supervision of other normal pregnancy, unspecified trimester: Secondary | ICD-10-CM | POA: Insufficient documentation

## 2023-12-24 NOTE — Progress Notes (Signed)
   PRENATAL VISIT NOTE  Subjective:  Lisa Massey is a 33 y.o. [phone] at [redacted]w[redacted]d being seen today for ongoing prenatal care.  She is currently monitored for the following issues for this high-risk pregnancy and has Language barrier; Supervision of other normal pregnancy, antepartum; Late prenatal care; History of postpartum hemorrhage, currently pregnant; BMI 37.0-37.9, adult - pregravid; Obesity in pregnancy; Low lying placenta, antepartum (resolved); E. coli UTI; Pyelonephritis affecting pregnancy in third trimester; and Right leg DVT (HCC) on their problem list.  Patient reports no bleeding, no contractions, no cramping, and no leaking.  Contractions: Not present. Vag. Bleeding: None.  Movement: Present. Denies leaking of fluid.   The following portions of the patient's history were reviewed and updated as appropriate: allergies, current medications, past family history, past medical history, past social history, past surgical history and problem list.   Objective:    Vitals:   12/24/23 1418  BP: 131/85  Pulse: 71  Weight: 202 lb (91.6 kg)    Fetal Status:  Fetal Heart Rate (bpm): 144   Movement: Present    General: Alert, oriented and cooperative. Patient is in no acute distress.  Skin: Skin is warm and dry. No rash noted.   Cardiovascular: Normal heart rate noted  Respiratory: Normal respiratory effort, no problems with respiration noted  Abdomen: Soft, gravid, appropriate for gestational age.  Pain/Pressure: Absent     Pelvic: Cervical exam performed in the presence of a chaperone        Extremities: Normal range of motion.  Edema: Trace  Mental Status: Normal mood and affect. Normal behavior. Normal judgment and thought content.   Assessment and Plan:  Pregnancy: G4P3003 at [redacted]w[redacted]d 1. Supervision of other normal pregnancy, antepartum (Primary) FHR BP appropriate  2. Language barrier Spanish interpreter used for visit  3. Obesity in pregnancy  4. E. coli UTI Recently  hospitalized for E. coli pyelonephritis.  Was started on Keflex  which she will take for 11 days.  Will transition to Macrobid until delivery  5. Low lying placenta, antepartum In early pregnancy, resolved  6. Acute deep vein thrombosis (DVT) of right lower extremity, unspecified vein (HCC) Recent acute deep vein thrombosis.  Was started on Lovenox daily.  Will induce at 39 weeks and patient will discontinue Lovenox 24 hours prior to delivery.  Antifactor Xa lab collected today  7. [redacted] weeks gestation of pregnancy   Preterm labor symptoms and general obstetric precautions including but not limited to vaginal bleeding, contractions, leaking of fluid and fetal movement were reviewed in detail with the patient. Please refer to After Visit Summary for other counseling recommendations.   No follow-ups on file.  Future Appointments  Date Time Provider Department Center  12/31/2023 11:15 AM Herchel Gloris LABOR, MD [facility]  01/07/2024  1:15 PM [name] LABOR EDDY [facility]  01/14/2024 10:55 AM [name], FNP [facility]    [name], MD

## 2023-12-25 LAB — GC/CHLAMYDIA PROBE AMP (~~LOC~~) NOT AT ARMC
Chlamydia: NEGATIVE
Comment: NEGATIVE
Comment: NORMAL
Neisseria Gonorrhea: NEGATIVE

## 2023-12-25 LAB — HEPARIN ANTI-XA: Heparin Anti-Xa: 0.71 [IU]/mL

## 2023-12-26 ENCOUNTER — Ambulatory Visit: Payer: Self-pay | Admitting: Family Medicine

## 2023-12-26 LAB — CULTURE, OB URINE

## 2023-12-26 LAB — URINE CULTURE, OB REFLEX

## 2023-12-28 LAB — CULTURE, BETA STREP (GROUP B ONLY): Strep Gp B Culture: NEGATIVE

## 2023-12-31 ENCOUNTER — Encounter: Payer: Self-pay | Admitting: Obstetrics & Gynecology

## 2023-12-31 ENCOUNTER — Other Ambulatory Visit: Payer: Self-pay

## 2023-12-31 ENCOUNTER — Other Ambulatory Visit (HOSPITAL_COMMUNITY)
Admission: RE | Admit: 2023-12-31 | Discharge: 2023-12-31 | Disposition: A | Discharge status: Home / Self Care | Payer: Self-pay | Source: Ambulatory Visit | Attending: Obstetrics & Gynecology | Admitting: Obstetrics & Gynecology

## 2023-12-31 ENCOUNTER — Ambulatory Visit: Payer: Self-pay | Admitting: Obstetrics & Gynecology

## 2023-12-31 VITALS — BP 122/85 | HR 89 | Wt 197.6 lb

## 2023-12-31 DIAGNOSIS — O2233 Deep phlebothrombosis in pregnancy, third trimester: Secondary | ICD-10-CM | POA: Insufficient documentation

## 2023-12-31 DIAGNOSIS — O26893 Other specified pregnancy related conditions, third trimester: Secondary | ICD-10-CM

## 2023-12-31 DIAGNOSIS — N898 Other specified noninflammatory disorders of vagina: Secondary | ICD-10-CM

## 2023-12-31 DIAGNOSIS — O2303 Infections of kidney in pregnancy, third trimester: Secondary | ICD-10-CM

## 2023-12-31 DIAGNOSIS — Z3A37 37 weeks gestation of pregnancy: Secondary | ICD-10-CM

## 2023-12-31 DIAGNOSIS — O0993 Supervision of high risk pregnancy, unspecified, third trimester: Secondary | ICD-10-CM

## 2023-12-31 NOTE — Progress Notes (Signed)
    Patient is Spanish-speaking only, interpreter present for this encounter.  PRENATAL VISIT NOTE  Subjective:  Lisa Massey is a 33 y.o. [phone] at [redacted]w[redacted]d being seen today for ongoing prenatal care.  She is currently monitored for the following issues for this high-risk pregnancy and has Language barrier; Supervision of high-risk pregnancy; Late prenatal care; History of postpartum hemorrhage, currently pregnant; BMI 37.0-37.9, adult - pregravid; Obesity in pregnancy; Pyelonephritis affecting pregnancy in third trimester; Right leg DVT (HCC); and Right Deep vein thrombosis (DVT) affecting pregnancy in third trimester on their problem list.  Patient reports yellow vaginal discharge and contractions.  Contractions: Irritability. Vag. Bleeding: None.  Movement: Present. Denies leaking of fluid.   The following portions of the patient's history were reviewed and updated as appropriate: allergies, current medications, past family history, past medical history, past social history, past surgical history and problem list.   Objective:    Vitals:   12/31/23 1101  BP: 122/85  Pulse: 89  Weight: 197 lb 9.6 oz (89.6 kg)    Fetal Status:  Fetal Heart Rate (bpm): 156   Movement: Present Presentation: Vertex  General: Alert, oriented and cooperative. Patient is in no acute distress.  Skin: Skin is warm and dry. No rash noted.   Cardiovascular: Normal heart rate noted  Respiratory: Normal respiratory effort, no problems with respiration noted  Abdomen: Soft, gravid, appropriate for gestational age.  Pain/Pressure: Present     Pelvic: Cervical exam performed in the presence of a chaperone Dilation: 2 Effacement (%): 70 Station: -2. Scant white discharge seen, sample obtained.  Extremities: Normal range of motion.  Edema: Trace  Mental Status: Normal mood and affect. Normal behavior. Normal judgment and thought content.   Assessment and Plan:  Pregnancy: G4P3003 at [redacted]w[redacted]d 1. Right Deep vein  thrombosis (DVT) affecting pregnancy in third trimester (Primary) Continue therapeutic Lovenox, reassuring Anti-Xa level on 12/24/23. IOL already scheduled at 39 weeks.  2. Pyelonephritis affecting pregnancy in third trimester On antibiotic suppression therapy.  3. Vaginal discharge in pregnancy in third trimester - Cervicovaginal ancillary only done, will follow up results and manage accordingly.  4. [redacted] weeks gestation of pregnancy 5. Supervision of high risk pregnancy in third trimester No other concerns. Term labor symptoms and general obstetric precautions including but not limited to vaginal bleeding, contractions, leaking of fluid and fetal movement were reviewed in detail with the patient. Please refer to After Visit Summary for other counseling recommendations.   Return in about 1 week (around 01/07/2024) for OFFICE OB VISIT (MD only).  Future Appointments  Date Time Provider Department Center  01/07/2024  1:15 PM [name] DELENA EDDY [facility]  01/12/2024  7:15 AM [facility] SCHED ROOM [facility]IN[facility] None  01/14/2024 10:55 AM [name], FNP [facility]    Gloris Hugger, MD

## 2024-01-01 ENCOUNTER — Ambulatory Visit: Payer: Self-pay | Admitting: Obstetrics & Gynecology

## 2024-01-01 LAB — CERVICOVAGINAL ANCILLARY ONLY
Bacterial Vaginitis (gardnerella): NEGATIVE
Candida Glabrata: NEGATIVE
Candida Vaginitis: NEGATIVE
Comment: NEGATIVE
Comment: NEGATIVE
Comment: NEGATIVE
Comment: NEGATIVE
Trichomonas: NEGATIVE

## 2024-01-07 ENCOUNTER — Ambulatory Visit: Payer: Self-pay | Admitting: Advanced Practice Midwife

## 2024-01-07 ENCOUNTER — Other Ambulatory Visit: Payer: Self-pay

## 2024-01-07 VITALS — BP 126/84 | HR 80 | Wt 203.5 lb

## 2024-01-07 DIAGNOSIS — O0993 Supervision of high risk pregnancy, unspecified, third trimester: Secondary | ICD-10-CM

## 2024-01-07 DIAGNOSIS — R519 Headache, unspecified: Secondary | ICD-10-CM

## 2024-01-07 DIAGNOSIS — Z758 Other problems related to medical facilities and other health care: Secondary | ICD-10-CM

## 2024-01-07 DIAGNOSIS — Z603 Acculturation difficulty: Secondary | ICD-10-CM

## 2024-01-07 DIAGNOSIS — O2233 Deep phlebothrombosis in pregnancy, third trimester: Secondary | ICD-10-CM

## 2024-01-07 DIAGNOSIS — O26893 Other specified pregnancy related conditions, third trimester: Secondary | ICD-10-CM

## 2024-01-07 DIAGNOSIS — Z3A38 38 weeks gestation of pregnancy: Secondary | ICD-10-CM

## 2024-01-07 NOTE — Progress Notes (Signed)
   PRENATAL VISIT NOTE  Subjective:  Lisa Massey is a 33 y.o. [phone] at [redacted]w[redacted]d being seen today for ongoing prenatal care.  She is currently monitored for the following issues for this high-risk pregnancy and has Language barrier; Supervision of high-risk pregnancy; Late prenatal care; History of postpartum hemorrhage, currently pregnant; BMI 37.0-37.9, adult - pregravid; Obesity in pregnancy; Pyelonephritis affecting pregnancy in third trimester; Right leg DVT (HCC); and Right Deep vein thrombosis (DVT) affecting pregnancy in third trimester on their problem list.  Patient reports no complaints.  Contractions: Irritability. Vag. Bleeding: None.  Movement: Present. Denies leaking of fluid.   The following portions of the patient's history were reviewed and updated as appropriate: allergies, current medications, past family history, past medical history, past social history, past surgical history and problem list.   Objective:    Vitals:   01/07/24 1316  BP: 126/84  Pulse: 80  Weight: 203 lb 8 oz (92.3 kg)    Fetal Status:  Fetal Heart Rate (bpm): 135   Movement: Present    General: Alert, oriented and cooperative. Patient is in no acute distress.  Skin: Skin is warm and dry. No rash noted.   Cardiovascular: Normal heart rate noted  Respiratory: Normal respiratory effort, no problems with respiration noted  Abdomen: Soft, gravid, appropriate for gestational age.  Pain/Pressure: Present     Pelvic: Cervical exam deferred        Extremities: Normal range of motion.  Edema: Trace  Mental Status: Normal mood and affect. Normal behavior. Normal judgment and thought content.   Assessment and Plan:  Pregnancy: G4P3003 at [redacted]w[redacted]d 1. Supervision of high risk pregnancy in third trimester (Primary) --Anticipatory guidance about next visits/weeks of pregnancy given.   2. Right Deep vein thrombosis (DVT) affecting pregnancy in third trimester --On Lovenox   3. Language barrier  4. [redacted]  weeks gestation of pregnancy   5. Pregnancy headache in third trimester --Intermittent. Discussed using Flexeril  (pt has Rx), Tylenol , caffeine , heat/ice, increase PO fluids. --Warning signs, reasons to go to MAU reviewed --IOL scheduled 01/12/24  Term labor symptoms and general obstetric precautions including but not limited to vaginal bleeding, contractions, leaking of fluid and fetal movement were reviewed in detail with the patient. Please refer to After Visit Summary for other counseling recommendations.   Return for As scheduled.  Future Appointments  Date Time Provider Department Center  01/12/2024  7:15 AM [facility] SCHED ROOM [facility]IN[facility] None    Olam Boards, CNM

## 2024-01-08 ENCOUNTER — Telehealth (HOSPITAL_COMMUNITY): Payer: Self-pay | Admitting: *Deleted

## 2024-01-08 NOTE — Telephone Encounter (Signed)
 Preadmission screen [phone] interpreter number

## 2024-01-09 ENCOUNTER — Telehealth (HOSPITAL_COMMUNITY): Payer: Self-pay | Admitting: *Deleted

## 2024-01-09 ENCOUNTER — Encounter (HOSPITAL_COMMUNITY): Payer: Self-pay | Admitting: *Deleted

## 2024-01-09 NOTE — Telephone Encounter (Signed)
 Preadmission screen Interpreter number [phone]

## 2024-01-12 ENCOUNTER — Inpatient Hospital Stay (HOSPITAL_COMMUNITY)
Admission: RE | Admit: 2024-01-12 | Discharge: 2024-01-14 | DRG: 768 | Disposition: A | Discharge status: Home / Self Care | Payer: Self-pay | Attending: Obstetrics and Gynecology | Admitting: Obstetrics and Gynecology

## 2024-01-12 ENCOUNTER — Inpatient Hospital Stay (HOSPITAL_COMMUNITY): Payer: Self-pay | Admitting: Anesthesiology

## 2024-01-12 ENCOUNTER — Encounter (HOSPITAL_COMMUNITY): Payer: Self-pay | Admitting: Family Medicine

## 2024-01-12 ENCOUNTER — Inpatient Hospital Stay (HOSPITAL_COMMUNITY): Payer: Self-pay

## 2024-01-12 DIAGNOSIS — I82401 Acute embolism and thrombosis of unspecified deep veins of right lower extremity: Secondary | ICD-10-CM | POA: Diagnosis present

## 2024-01-12 DIAGNOSIS — Z3A39 39 weeks gestation of pregnancy: Secondary | ICD-10-CM | POA: Diagnosis not present

## 2024-01-12 DIAGNOSIS — Z833 Family history of diabetes mellitus: Secondary | ICD-10-CM

## 2024-01-12 DIAGNOSIS — Z86718 Personal history of other venous thrombosis and embolism: Secondary | ICD-10-CM | POA: Diagnosis present

## 2024-01-12 DIAGNOSIS — Z8249 Family history of ischemic heart disease and other diseases of the circulatory system: Secondary | ICD-10-CM | POA: Diagnosis not present

## 2024-01-12 DIAGNOSIS — O2233 Deep phlebothrombosis in pregnancy, third trimester: Secondary | ICD-10-CM | POA: Diagnosis present

## 2024-01-12 DIAGNOSIS — O2230 Deep phlebothrombosis in pregnancy, unspecified trimester: Principal | ICD-10-CM | POA: Diagnosis present

## 2024-01-12 DIAGNOSIS — O9882 Other maternal infectious and parasitic diseases complicating childbirth: Secondary | ICD-10-CM

## 2024-01-12 DIAGNOSIS — O99214 Obesity complicating childbirth: Secondary | ICD-10-CM | POA: Diagnosis present

## 2024-01-12 LAB — CBC
HCT: 34.5 % — ABNORMAL LOW (ref 36.0–46.0)
Hemoglobin: 11.4 g/dL — ABNORMAL LOW (ref 12.0–15.0)
MCH: 27.7 pg (ref 26.0–34.0)
MCHC: 33 g/dL (ref 30.0–36.0)
MCV: 83.9 fL (ref 80.0–100.0)
Platelets: 194 K/uL (ref 150–400)
RBC: 4.11 MIL/uL (ref 3.87–5.11)
RDW: 14.9 % (ref 11.5–15.5)
WBC: 7.1 K/uL (ref 4.0–10.5)
nRBC: 0 % (ref 0.0–0.2)

## 2024-01-12 LAB — COMPREHENSIVE METABOLIC PANEL WITH GFR
ALT: 13 U/L (ref 0–44)
AST: 19 U/L (ref 15–41)
Albumin: 2.6 g/dL — ABNORMAL LOW (ref 3.5–5.0)
Alkaline Phosphatase: 124 U/L (ref 38–126)
Anion gap: 7 (ref 5–15)
BUN: 5 mg/dL — ABNORMAL LOW (ref 6–20)
CO2: 19 mmol/L — ABNORMAL LOW (ref 22–32)
Calcium: 8.9 mg/dL (ref 8.9–10.3)
Chloride: 110 mmol/L (ref 98–111)
Creatinine, Ser: 0.66 mg/dL (ref 0.44–1.00)
GFR, Estimated: >60 mL/min (ref >60)
Glucose, Bld: 86 mg/dL (ref 70–99)
Potassium: 3.9 mmol/L (ref 3.5–5.1)
Sodium: 136 mmol/L (ref 135–145)
Total Bilirubin: 0.2 mg/dL (ref 0.0–1.2)
Total Protein: 6.1 g/dL — ABNORMAL LOW (ref 6.5–8.1)

## 2024-01-12 LAB — TYPE AND SCREEN
ABO/RH(D): B POS
Antibody Screen: NEGATIVE

## 2024-01-12 LAB — PROTEIN / CREATININE RATIO, URINE
Creatinine, Urine: 27 mg/dL
Total Protein, Urine: <6 mg/dL

## 2024-01-12 LAB — RPR: RPR Ser Ql: NONREACTIVE

## 2024-01-12 MED ORDER — PHENYLEPHRINE 80 MCG/ML (10ML) SYRINGE FOR IV PUSH (FOR BLOOD PRESSURE SUPPORT): 80.0000 ug | PREFILLED_SYRINGE | INTRAVENOUS | Status: DC | PRN

## 2024-01-12 MED ORDER — TERBUTALINE SULFATE 1 MG/ML IJ SOLN: 0.2500 mg | Freq: Once | INTRAMUSCULAR | Status: DC | PRN

## 2024-01-12 MED ORDER — OXYCODONE HCL 5 MG PO TABS: 5.0000 mg | ORAL_TABLET | ORAL | Status: DC | PRN

## 2024-01-12 MED ORDER — MISOPROSTOL 50MCG HALF TABLET
50.0000 ug | ORAL_TABLET | Freq: Once | ORAL | Status: AC
  Administered 2024-01-12: 50 ug via ORAL
  Filled 2024-01-12: qty 1

## 2024-01-12 MED ORDER — BENZOCAINE-MENTHOL 20-0.5 % EX AERO: 1.0000 | INHALATION_SPRAY | CUTANEOUS | Status: DC | PRN

## 2024-01-12 MED ORDER — SOD CITRATE-CITRIC ACID 500-334 MG/5ML PO SOLN: 30.0000 mL | ORAL | Status: DC | PRN

## 2024-01-12 MED ORDER — TRANEXAMIC ACID-NACL 1000-0.7 MG/100ML-% IV SOLN
INTRAVENOUS | Status: AC
Start: 2024-01-12 — End: 2024-01-13
  Filled 2024-01-12: qty 100

## 2024-01-12 MED ORDER — DIPHENHYDRAMINE HCL 25 MG PO CAPS: 25.0000 mg | ORAL_CAPSULE | Freq: Four times a day (QID) | ORAL | Status: DC | PRN

## 2024-01-12 MED ORDER — LACTATED RINGERS IV SOLN: INTRAVENOUS | Status: AC

## 2024-01-12 MED ORDER — COCONUT OIL OIL: 1.0000 | TOPICAL_OIL | Status: DC | PRN

## 2024-01-12 MED ORDER — OXYCODONE-ACETAMINOPHEN 5-325 MG PO TABS: 2.0000 | ORAL_TABLET | ORAL | Status: DC | PRN

## 2024-01-12 MED ORDER — IBUPROFEN 600 MG PO TABS
600.0000 mg | ORAL_TABLET | Freq: Four times a day (QID) | ORAL | Status: DC
  Administered 2024-01-12 – 2024-01-14 (×7): 600 mg via ORAL
  Filled 2024-01-12 (×7): qty 1

## 2024-01-12 MED ORDER — CEFAZOLIN SODIUM-DEXTROSE 1-4 GM/50ML-% IV SOLN
1.0000 g | Freq: Three times a day (TID) | INTRAVENOUS | Status: DC
  Administered 2024-01-13: 1 g via INTRAVENOUS
  Filled 2024-01-12 (×2): qty 50

## 2024-01-12 MED ORDER — ZOLPIDEM TARTRATE 5 MG PO TABS: 5.0000 mg | ORAL_TABLET | Freq: Every evening | ORAL | Status: DC | PRN

## 2024-01-12 MED ORDER — FENTANYL-BUPIVACAINE-NACL 0.5-0.125-0.9 MG/250ML-% EP SOLN
12.0000 mL/h | EPIDURAL | Status: DC | PRN
  Administered 2024-01-12: 12 mL/h via EPIDURAL
  Filled 2024-01-12: qty 250

## 2024-01-12 MED ORDER — DIPHENHYDRAMINE HCL 50 MG/ML IJ SOLN: 12.5000 mg | INTRAMUSCULAR | Status: DC | PRN

## 2024-01-12 MED ORDER — SIMETHICONE 80 MG PO CHEW: 80.0000 mg | CHEWABLE_TABLET | ORAL | Status: DC | PRN

## 2024-01-12 MED ORDER — ONDANSETRON HCL 4 MG PO TABS: 4.0000 mg | ORAL_TABLET | ORAL | Status: DC | PRN

## 2024-01-12 MED ORDER — PRENATAL MULTIVITAMIN CH
1.0000 | ORAL_TABLET | Freq: Every day | ORAL | Status: DC
  Administered 2024-01-13 – 2024-01-14 (×2): 1 via ORAL
  Filled 2024-01-12 (×2): qty 1

## 2024-01-12 MED ORDER — TETANUS-DIPHTH-ACELL PERTUSSIS 5-2.5-18.5 LF-MCG/0.5 IM SUSY: 0.5000 mL | PREFILLED_SYRINGE | Freq: Once | INTRAMUSCULAR | Status: DC

## 2024-01-12 MED ORDER — LACTATED RINGERS IV SOLN: 500.0000 mL | INTRAVENOUS | Status: AC | PRN

## 2024-01-12 MED ORDER — LACTATED RINGERS IV SOLN
500.0000 mL | Freq: Once | INTRAVENOUS | Status: AC
  Administered 2024-01-12: 500 mL via INTRAVENOUS

## 2024-01-12 MED ORDER — OXYTOCIN-SODIUM CHLORIDE 30-0.9 UT/500ML-% IV SOLN
2.5000 [IU]/h | INTRAVENOUS | Status: DC
  Administered 2024-01-12 (×2): 2.5 [IU]/h via INTRAVENOUS
  Filled 2024-01-12 (×2): qty 500

## 2024-01-12 MED ORDER — TRANEXAMIC ACID-NACL 1000-0.7 MG/100ML-% IV SOLN
1000.0000 mg | INTRAVENOUS | Status: AC
  Administered 2024-01-12: 1000 mg via INTRAVENOUS

## 2024-01-12 MED ORDER — OXYCODONE HCL 5 MG PO TABS: 10.0000 mg | ORAL_TABLET | ORAL | Status: DC | PRN

## 2024-01-12 MED ORDER — ONDANSETRON HCL 4 MG/2ML IJ SOLN: 4.0000 mg | INTRAMUSCULAR | Status: DC | PRN

## 2024-01-12 MED ORDER — FENTANYL CITRATE (PF) 100 MCG/2ML IJ SOLN
100.0000 ug | INTRAMUSCULAR | Status: DC | PRN
  Administered 2024-01-12: 100 ug via INTRAVENOUS
  Filled 2024-01-12: qty 2

## 2024-01-12 MED ORDER — CEFAZOLIN SODIUM-DEXTROSE 2-4 GM/100ML-% IV SOLN
2.0000 g | Freq: Once | INTRAVENOUS | Status: AC
  Administered 2024-01-12: 2 g via INTRAVENOUS
  Filled 2024-01-12: qty 100

## 2024-01-12 MED ORDER — ACETAMINOPHEN 325 MG PO TABS: 650.0000 mg | ORAL_TABLET | ORAL | Status: DC | PRN

## 2024-01-12 MED ORDER — LIDOCAINE HCL (PF) 1 % IJ SOLN
INTRAMUSCULAR | Status: DC | PRN
  Administered 2024-01-12 (×2): 4 mL via EPIDURAL

## 2024-01-12 MED ORDER — MISOPROSTOL 25 MCG QUARTER TABLET
25.0000 ug | ORAL_TABLET | Freq: Once | ORAL | Status: AC
  Administered 2024-01-12: 25 ug via VAGINAL
  Filled 2024-01-12: qty 1

## 2024-01-12 MED ORDER — EPHEDRINE 5 MG/ML INJ: 10.0000 mg | INTRAVENOUS | Status: DC | PRN

## 2024-01-12 MED ORDER — LIDOCAINE HCL (PF) 1 % IJ SOLN: 30.0000 mL | INTRAMUSCULAR | Status: DC | PRN

## 2024-01-12 MED ORDER — SENNOSIDES-DOCUSATE SODIUM 8.6-50 MG PO TABS
2.0000 | ORAL_TABLET | Freq: Every day | ORAL | Status: DC
  Administered 2024-01-13 – 2024-01-14 (×2): 2 via ORAL
  Filled 2024-01-12 (×2): qty 2

## 2024-01-12 MED ORDER — MISOPROSTOL 200 MCG PO TABS
1000.0000 ug | ORAL_TABLET | Freq: Once | ORAL | Status: AC
  Administered 2024-01-12: 1000 ug via RECTAL

## 2024-01-12 MED ORDER — OXYCODONE-ACETAMINOPHEN 5-325 MG PO TABS: 1.0000 | ORAL_TABLET | ORAL | Status: DC | PRN

## 2024-01-12 MED ORDER — TRANEXAMIC ACID-NACL 1000-0.7 MG/100ML-% IV SOLN
INTRAVENOUS | Status: AC
  Filled 2024-01-12: qty 100

## 2024-01-12 MED ORDER — OXYTOCIN BOLUS FROM INFUSION
333.0000 mL | Freq: Once | INTRAVENOUS | Status: AC
  Administered 2024-01-12: 333 mL via INTRAVENOUS

## 2024-01-12 MED ORDER — DIBUCAINE (PERIANAL) 1 % EX OINT: 1.0000 | TOPICAL_OINTMENT | CUTANEOUS | Status: DC | PRN

## 2024-01-12 MED ORDER — ONDANSETRON HCL 4 MG/2ML IJ SOLN: 4.0000 mg | Freq: Four times a day (QID) | INTRAMUSCULAR | Status: DC | PRN

## 2024-01-12 MED ORDER — METHYLERGONOVINE MALEATE 0.2 MG/ML IJ SOLN
0.2000 mg | Freq: Once | INTRAMUSCULAR | Status: AC
  Administered 2024-01-12: 0.2 mg via INTRAMUSCULAR

## 2024-01-12 MED ORDER — WITCH HAZEL-GLYCERIN EX PADS: 1.0000 | MEDICATED_PAD | CUTANEOUS | Status: DC | PRN

## 2024-01-12 NOTE — Anesthesia Procedure Notes (Signed)
 Epidural Patient location during procedure: OB Start time: 01/12/2024 4:20 PM End time: 01/12/2024 4:25 PM  Staffing Anesthesiologist: Paul Lamarr BRAVO, MD Performed: anesthesiologist   Preanesthetic Checklist Completed: patient identified, IV checked, risks and benefits discussed, monitors and equipment checked, pre-op evaluation and timeout performed  Epidural Patient position: sitting Prep: DuraPrep and site prepped and draped Patient monitoring: continuous pulse ox, blood pressure and heart rate Approach: midline Location: L3-L4 Injection technique: LOR air  Needle:  Needle type: Tuohy  Needle gauge: 17 G Needle length: 9 cm Needle insertion depth: 5 cm Catheter type: closed end flexible Catheter size: 19 Gauge Catheter at skin depth: 10 cm Test dose: negative and Other (1% lidocaine )  Assessment Events: blood not aspirated, no cerebrospinal fluid, injection not painful, no injection resistance, no paresthesia and negative IV test  Additional Notes Patient identified. Risks, benefits, and alternatives discussed with patient including but not limited to bleeding, infection, nerve damage, paralysis, failed block, incomplete pain control, headache, blood pressure changes, nausea, vomiting, reactions to medication, itching, and postpartum back pain. Confirmed with bedside nurse the patient's most recent platelet count. Confirmed with patient that they are not currently taking any anticoagulation, have any bleeding history, or any family history of bleeding disorders. Patient expressed understanding and wished to proceed. All questions were answered. Sterile technique was used throughout the entire procedure. Please see nursing notes for vital signs.   Crisp LOR on second attempt. Test dose was given through epidural catheter and negative prior to continuing to dose epidural or start infusion. Warning signs of high block given to the patient including shortness of breath,  tingling/numbness in hands, complete motor block, or any concerning symptoms with instructions to call for help. Patient was given instructions on fall risk and not to get out of bed. All questions and concerns addressed with instructions to call with any issues or inadequate analgesia.  Reason for block:procedure for pain

## 2024-01-12 NOTE — H&P (Addendum)
 Lisa Massey is a 33 y.o. female presenting for IOL for R leg DVT. Patient has hx for PPH, and BMI >30 with a (RESOLVED) LLP. At 34 weeks was admitted for Pyelonephritis and while inpatient developed a R Leg DVT. Was started on Lovenox .   Last US  noted: Cephalic Presentation with posterior placenta @ 32 weeks EFW Est. FW: 1216 gm 2 lb 11 oz 34 % . Proven pelvis up to 4196g  OB History     Gravida  4   Para  3   Term  3   Preterm      AB      Living  3      SAB      IAB      Ectopic      Multiple  0   Live Births  3          Past Medical History:  Diagnosis Date   History of DVT (deep vein thrombosis)    Medical history non-contributory    Past Surgical History:  Procedure Laterality Date   APPENDECTOMY     nexplanon       inserted 11/2018 per patient   Family History: family history includes Cancer in her sister; Diabetes in her maternal grandmother; Hypertension in her father. Social History:  reports that she has never smoked. She has never used smokeless tobacco. She reports that she does not drink alcohol and does not use drugs.      NURSING  PROVIDER  [employer], [employer] [facility] [employer] Dating by  US  @ 10w  PNC Model Traditional Anatomy U/S LL placenta  Initiated care at  [employer]  Spanish               LAB RESULTS   Support Person FOB Genetics NIPS: LR AFP:       NT/IT (FT only)        Carrier Screen Horizon:   Rhogam  B/Positive/-- (02/26 1524) A1C/GTT Early HgbA1C: 5.3 Third trimester 2 hr GTT: neg  Flu Vaccine 08/22/23      TDaP Vaccine  10/17/19 Blood Type B/Positive/-- (02/26 1524)  RSV Vaccine   Antibody Negative (02/26 1524)  COVID Vaccine 2-doses Rubella 2.03 (02/26 1524)  Feeding Plan both RPR Non Reactive (02/26 1524)  Contraception Undecided HBsAg Negative (02/26 1524)  Circumcision No HIV Non Reactive (02/26 1524)  Pediatrician  [facility] HCVAb Non Reactive (02/26 1524)  Prenatal Classes        BTL  Consent   Pap       Diagnosis  Date Value Ref Range Status  02/21/2022     Final    - Negative for Intraepithelial Lesions or Malignancy (NILM)  02/21/2022 - Benign reactive/reparative changes   Final    BTL Pre-payment   GC/CT Initial: neg   36wks:    VBAC Consent   GBS For PCN allergy, check sensitivities   BRx Optimized? [ ]  yes   [ ]  no      DME Rx [ ]  BP cuff [ ]  Weight Scale Waterbirth  [ ]  Class [ ]  Consent [ ]  CNM visit  PHQ9 & GAD7 [  ] new OB [  ] 28 weeks  [  ] 36 weeks Induction  [ ]  Orders Entered [ ] Foley Y/N   Review of Systems Maternal Medical History:  Fetal activity: Perceived fetal activity is normal.  Prenatal Complications - Diabetes: none.   Dilation: 1 Effacement (%): 50 Station: Ballotable Exam by:: Corean Berber, RN Blood pressure 136/79, pulse 69, temperature 98.2 F (36.8 C), temperature source Oral, resp. rate 18, height 5' 2 (1.575 m), weight 92.9 kg, last menstrual period 03/18/2023, SpO2 100%. Maternal Exam:  Uterine Assessment: Contraction strength is mild.  Contraction frequency is irregular.  Abdomen: Patient reports no abdominal tenderness. Fetal presentation: vertex Pelvis: adequate for delivery.   Cervix: Cervix evaluated by digital exam.     Fetal Exam Fetal Monitor Review: Baseline rate: 135.  Variability: moderate (6-25 bpm).   Pattern: accelerations present and no decelerations.   Fetal State Assessment: Category I - tracings are normal.   Physical Exam Vitals and nursing note reviewed.  Constitutional:      General: She is not in acute distress.    Appearance: Normal appearance.  HENT:     Head: Normocephalic.  Pulmonary:     Effort: Pulmonary effort is normal.  Genitourinary:    Comments: Dilation: 1 Effacement (%): 50 Station: Ballotable Exam by:: Corean Berber, RN  Musculoskeletal:     Cervical back: Normal range of motion.  Skin:    General: Skin is warm and dry.  Neurological:     Mental Status:  She is alert and oriented to person, place, and time.  Psychiatric:        Mood and Affect: Mood normal.     Prenatal labs: ABO, Rh: --/--/B POS (08/16 0931) Antibody: NEG (08/16 0931) Rubella: 2.03 (02/26 1524) RPR: NON REACTIVE (08/16 0930)  HBsAg: Negative (02/26 1524)  HIV: Non Reactive (05/21 9187)  GBS: Negative/-- (07/28 1551)   Assessment/Plan: Lisa Massey , a  33 y.o. [phone] at [redacted]w[redacted]d presents for IOL with R DVT (on lovenox ). Last dose was 01/09/24.   Labor: Dual Cytotec  given. Plan to reassess for FB at next interval.  FWB: Cat I  I/D: GBS negative  Pain: patient may have epidural upon request  FNQ:Anuy breast and bottle  MOC: Unsure  Circ: No  MOD: Hopeful for vaginal delivery.    Lisa CHRISTELLA Cedar, MSN CNM  01/12/2024, 2:46 PM

## 2024-01-12 NOTE — Discharge Summary (Signed)
 Postpartum Discharge Summary  Date of Service updated***     Patient Name: Lisa Massey DOB: [birth year] MRN: [id number]  Date of admission: 01/12/2024 Delivery date:01/12/2024 Delivering provider: PAYNE, SHANTONETTE M Date of discharge: 01/12/2024  Admitting diagnosis: DVT (deep vein thrombosis) in pregnancy [O22.30] Intrauterine pregnancy: [redacted]w[redacted]d     Secondary diagnosis:  Principal Problem:   DVT (deep vein thrombosis) in pregnancy  Additional problems:  Patient Active Problem List   Diagnosis Date Noted   DVT (deep vein thrombosis) in pregnancy 01/12/2024   Right Deep vein thrombosis (DVT) affecting pregnancy in third trimester 12/31/2023   Pyelonephritis affecting pregnancy in third trimester 12/21/2023   Right leg DVT (HCC) 12/21/2023   History of postpartum hemorrhage, currently pregnant 07/25/2023   BMI 37.0-37.9, adult - pregravid 07/25/2023   Obesity in pregnancy 07/25/2023   Late prenatal care    Supervision of high-risk pregnancy 07/18/2023   Language barrier 12/17/2018       Discharge diagnosis: {DX.:23714}                                              Post partum procedures:{Postpartum procedures:23558} Augmentation: AROM and Cytotec  Complications: None  Hospital course: Induction of Labor With Vaginal Delivery   33 y.o. yo [phone] at [redacted]w[redacted]d was admitted to the hospital 01/12/2024 for induction of labor.  Indication for induction: R leg DVT last lovenox  was on 01/09/24.  Patient had an labor course complicated by N/A  Membrane Rupture Time/Date: 4:00 PM,01/12/2024  Delivery Method:Vaginal, Spontaneous Operative Delivery:{Operative Delivery:30121} Episiotomy: None Lacerations:  Cervical Details of delivery can be found in separate delivery note.  Patient had a postpartum course complicated by [facility] ***. Patient is discharged home 01/12/24.  Newborn Data: Birth date:01/12/2024 Birth time:5:10 PM Gender:Female Living status:Living Apgars:9 ,9  Weight:3610  g  Magnesium Sulfate received: No BMZ received: No Rhophylac:N/A MMR:N/A immune  T-DaP:Given prenatally Flu: Yes 08/22/23 RSV Vaccine received: No Transfusion:{Transfusion received:30440034}  Immunizations received: Immunization History  Administered Date(s) Administered   Influenza, Seasonal, Injecte, Preservative Fre 08/23/2023   Tdap 10/17/2023    Physical exam  Vitals:   01/12/24 1850 01/12/24 1855 01/12/24 1915 01/12/24 1945  BP:   137/84 131/79  Pulse:   70 76  Resp:      Temp:      TempSrc:      SpO2: 100% 100% 100% 99%  Weight:      Height:       General: {Exam; general:21111117} Lochia: {Desc; appropriate/inappropriate:30686::appropriate} Uterine Fundus: {Desc; firm/soft:30687} Incision: {Exam; incision:21111123} DVT Evaluation: {Exam; dvt:2111122} Labs: Lab Results  Component Value Date   WBC 7.1 01/12/2024   HGB 11.4 (L) 01/12/2024   HCT 34.5 (L) 01/12/2024   MCV 83.9 01/12/2024   PLT 194 01/12/2024      Latest Ref Rng & Units 01/12/2024    2:18 PM  CMP  Glucose 70 - 99 mg/dL 86   BUN 6 - 20 mg/dL 5   Creatinine 9.55 - 8.99 mg/dL 9.33   Sodium 864 - 854 mmol/L 136   Potassium 3.5 - 5.1 mmol/L 3.9   Chloride 98 - 111 mmol/L 110   CO2 22 - 32 mmol/L 19   Calcium 8.9 - 10.3 mg/dL 8.9   Total Protein 6.5 - 8.1 g/dL 6.1   Total Bilirubin 0.0 - 1.2 mg/dL 0.2   Alkaline Phos 38 -  126 U/L 124   AST 15 - 41 U/L 19   ALT 0 - 44 U/L 13    Edinburgh Score:    12/17/2018    4:49 PM  Edinburgh Postnatal Depression Scale Screening Tool  I have been able to laugh and see the funny side of things. 0  I have looked forward with enjoyment to things. 0  I have blamed myself unnecessarily when things went wrong. 0  I have been anxious or worried for no good reason. 0  I have felt scared or panicky for no good reason. 0  Things have been getting on top of me. 1  I have been so unhappy that I have had difficulty sleeping. 0  I have felt sad or miserable. 0   I have been so unhappy that I have been crying. 0  The thought of harming myself has occurred to me. 0  Edinburgh Postnatal Depression Scale Total 1      Data saved with a previous flowsheet row definition   No data recorded  After visit meds:  Allergies as of 01/12/2024   No Known Allergies   Med Rec must be completed prior to using this [facility]***        Discharge home in stable condition Infant Feeding: {Baby feeding:23562} Infant Disposition:{CHL IP OB HOME WITH FNUYZM:76418} Discharge instruction: per After Visit Summary and Postpartum booklet. Activity: Advance as tolerated. Pelvic rest for 6 weeks.  Diet: {OB ipzu:78888878} Future Appointments:No future appointments. Follow up Visit:   Please schedule this patient for a In person postpartum visit in 4 weeks with the following provider: MD. Additional Postpartum F/U:N/A   High risk pregnancy complicated by: Bleeding disorder on Lovenox  Delivery mode:  Vaginal, Spontaneous Anticipated Birth Control:  Unsure   01/12/2024 Claris CHRISTELLA Cedar, CNM

## 2024-01-12 NOTE — Progress Notes (Addendum)
 Faculty Note  In to see patient for EBL 900 and trickling after vaginal delivery. On exam, patient has a small amount of bright red bleeding from the cervical bed at 10 o'clock, several figure of 8 sutures were placed with 2-0 vicryl to good effect. On 360 degree exam with ring forceps of the cervix, no additional cervical bleeding was noted but brisk bright red bleeding noted from os. I removed several hundred in dark red clots from the uterus with endometrium feeling clear of retained products. Uterus firm at this point however bright red bleeding continued. Gave patient repeat dose TXA. Called for [employer] device, Jada device placed without issue and vaginal balloon inflated to 120 mL saline and suction hooked to wall suction. Continued to have small trickle of blood around device, however then it slowed and suctioned blood from uterus slowed. 1000 mcg cytotec  placed rectally and 1 dose methergine  given IM. 1 dose ancef  given. Total EBL ~1100 mL including delivery. Will plan for Jada to stay in overnight. Briefly reviewed further options for management with patient if Jada does not improve/stop bleeding including hysterectomy. Pt okay with blood products if needed, starting hgb 11 so will recheck in several hours. All interview/exam done with spanish interpretor, pt verbalized understanding and in agreement with plan.   LOIS Yolanda Moats, MD, [facility] Attending Center for [employer] [facility])

## 2024-01-12 NOTE — Anesthesia Preprocedure Evaluation (Signed)
 Anesthesia Evaluation  Patient identified by MRN, date of birth, ID band Patient awake    Reviewed: Allergy & Precautions, Patient's Chart, lab work & pertinent test results  History of Anesthesia Complications Negative for: history of anesthetic complications  Airway Mallampati: II  TM Distance: >3 FB Neck ROM: Full    Dental no notable dental hx.    Pulmonary neg pulmonary ROS   Pulmonary exam normal        Cardiovascular + DVT (on lovenox  (last dose 01/09/24))  Normal cardiovascular exam     Neuro/Psych negative neurological ROS     GI/Hepatic negative GI ROS, Neg liver ROS,,,  Endo/Other  negative endocrine ROS    Renal/GU negative Renal ROS  negative genitourinary   Musculoskeletal negative musculoskeletal ROS (+)    Abdominal   Peds  Hematology negative hematology ROS (+)   Anesthesia Other Findings Day of surgery medications reviewed with patient.  Reproductive/Obstetrics (+) Pregnancy                              Anesthesia Physical Anesthesia Plan  ASA: 2  Anesthesia Plan: Epidural   Post-op Pain Management:    Induction:   PONV Risk Score and Plan: Treatment may vary due to age or medical condition  Airway Management Planned: Natural Airway  Additional Equipment: Fetal Monitoring  Intra-op Plan:   Post-operative Plan:   Informed Consent: I have reviewed the patients History and Physical, chart, labs and discussed the procedure including the risks, benefits and alternatives for the proposed anesthesia with the patient or authorized representative who has indicated his/her understanding and acceptance.     Interpreter used for interview  Plan Discussed with:   Anesthesia Plan Comments:         Anesthesia Quick Evaluation

## 2024-01-12 NOTE — Plan of Care (Signed)

## 2024-01-13 ENCOUNTER — Encounter (HOSPITAL_COMMUNITY): Payer: Self-pay | Admitting: Family Medicine

## 2024-01-13 ENCOUNTER — Other Ambulatory Visit: Payer: Self-pay

## 2024-01-13 LAB — CBC
HCT: 26.2 % — ABNORMAL LOW (ref 36.0–46.0)
Hemoglobin: 8.7 g/dL — ABNORMAL LOW (ref 12.0–15.0)
MCH: 27.9 pg (ref 26.0–34.0)
MCHC: 33.2 g/dL (ref 30.0–36.0)
MCV: 84 fL (ref 80.0–100.0)
Platelets: 173 K/uL (ref 150–400)
RBC: 3.12 MIL/uL — ABNORMAL LOW (ref 3.87–5.11)
RDW: 15 % (ref 11.5–15.5)
WBC: 12 K/uL — ABNORMAL HIGH (ref 4.0–10.5)
nRBC: 0 % (ref 0.0–0.2)

## 2024-01-13 MED ORDER — FERROUS SULFATE 325 (65 FE) MG PO TABS
325.0000 mg | ORAL_TABLET | Freq: Every day | ORAL | Status: DC
  Administered 2024-01-13 – 2024-01-14 (×2): 325 mg via ORAL
  Filled 2024-01-13 (×2): qty 1

## 2024-01-13 MED ORDER — ENOXAPARIN SODIUM 100 MG/ML IJ SOSY
100.0000 mg | PREFILLED_SYRINGE | Freq: Two times a day (BID) | INTRAMUSCULAR | Status: DC
  Administered 2024-01-14 (×2): 100 mg via SUBCUTANEOUS
  Filled 2024-01-13 (×2): qty 1

## 2024-01-13 NOTE — Progress Notes (Addendum)
 POSTPARTUM PROGRESS NOTE  Subjective: 33 y.o. [id number] who is PPD1 s/p NSVD  Course complicated by: postpartum hemorrhage (QBL 1214 ml), Jada placed at delivery  Doing well this morning. Reports pain in her back but otherwise feels well  Objective: Blood pressure 134/62, pulse 76, temperature 98.9 F (37.2 C), temperature source Oral, resp. rate 17, height 5' 2 (1.575 m), weight 92.9 kg, last menstrual period 03/18/2023, SpO2 99%, unknown if currently breastfeeding.  Physical Exam:  General: alert, cooperative, and no distress Abdomen: soft, nontender nondistended Uterine Fundus: firm, tilted to patient' right Lochia: appropriate Incision: n/a Lower extremities: No significant calf/ankle edema.  Jada disconnected from suction and balloon deflated Small trickle with fundal exam  Recent Labs    01/12/24 0930 01/13/24 0435  HGB 11.4* 8.7*  HCT 34.5* 26.2*    Assessment/Plan: Postpartum: if bleeding remain stable after Jada off suction x 30 minutes, will remove and advance milestones, remove foley, etc  2.  Hx DVT during pregnancy: ok to restart lovenox  if bleeding remains stable  3.  Postpartum hemorrhage: s/p Jada, Hgb 8.7 this am which is appropriate for her blood loss, start iron   Video spanish interpreter Adron # [phone] utilized   LOS: 1 day    Rollo ONEIDA Bring, MD, FACOG Obstetrician & Gynecologist, [facility] for [facility], [facility] 01/13/2024, 6:43 AM   Addendum: Jada removed, small trickle with fundal. Will observe bleeding x 1 hour. If remains minimal/appropriate, will restart lovenox  and remove foley.  Interpreter [id number]  Rollo ONEIDA Bring, MD, FACOG Obstetrician & Gynecologist, [facility] for [facility], [facility]

## 2024-01-13 NOTE — Lactation Note (Signed)
 This note was copied from a baby's chart. Lactation Consultation Note  Patient Name: Lisa Massey Unijb'd Date: 01/13/2024 Age:34 hours  LC has looked in on mom a couple of times tonight and she has been sleeping.    Maternal Data    Feeding Nipple Type: Slow - flow  LATCH Score                    Lactation Tools Discussed/Used    Interventions    Discharge    Consult Status      Maigan Bittinger G 01/13/2024, 4:03 AM

## 2024-01-13 NOTE — Lactation Note (Signed)
 This note was copied from a baby's chart. Lactation Consultation Note  Patient Name: Lisa Massey Date: 01/13/2024 Age:34 hours  LC asked RN to call me when mom wakes up. RN informed LC that mom has a lot going and may have to go to surgery. RN recommended LC see mom later today.   Maternal Data    Feeding Nipple Type: Slow - flow  LATCH Score                    Lactation Tools Discussed/Used    Interventions    Discharge    Consult Status      Lisa Massey 01/13/2024, 4:10 AM

## 2024-01-13 NOTE — Anesthesia Postprocedure Evaluation (Signed)
 Anesthesia Post Note  Patient: Lisa Massey  Procedure(s) Performed: AN AD HOC LABOR EPIDURAL     Patient location during evaluation: Mother Baby Anesthesia Type: Epidural Level of consciousness: awake, awake and alert and oriented Pain management: satisfactory to patient Vital Signs Assessment: post-procedure vital signs reviewed and stable Respiratory status: spontaneous breathing, nonlabored ventilation and respiratory function stable Cardiovascular status: blood pressure returned to baseline and stable Postop Assessment: no headache, no backache, no apparent nausea or vomiting, patient able to bend at knees, adequate PO intake and able to ambulate Anesthetic complications: no   No notable events documented.  Last Vitals:  Vitals:   01/13/24 0600 01/13/24 0746  BP: 134/62 124/66  Pulse: 76 66  Resp: 17 18  Temp: 37.2 C 36.7 C  SpO2: 99% 100%    Last Pain:  Vitals:   01/13/24 0906  TempSrc:   PainSc: 0-No pain   Pain Goal:                   Jerimyah Vandunk

## 2024-01-13 NOTE — Plan of Care (Signed)

## 2024-01-13 NOTE — Lactation Note (Signed)
 This note was copied from a baby's chart. Lactation Consultation Note  Patient Name: Boy Kaileen Bronkema Unijb'd Date: 01/13/2024 Age:34 hours Reason for consult: Initial assessment;Term;Other (Comment) (PPH EBL 1215 ml)  LC in to visit with P4 Mom of term baby delivered vaginally.  Mom  had a PPH and has been tired.  She is more alert now and states she would like help with breastfeeding.  Ipad interpreter [facility] assisted with consult.  Mom experienced breastfeeding her other children for 6 months.   Baby started to gag while in crib, LC sat baby upright and burped him.  He settled down.  LC placed baby STS on Mom's chest.  Some cueing noted and LC assisted to show hand expression and placed baby prone over Mom's breast.  Baby opened his mouth once and sucked once and then fell asleep.  Assisted Mom in moving baby on her chest where he fell asleep.  Talked to Mom and FOB about the importance of pumping to support her milk supply due to numerous formula feedings and her PPH.  Mom agreeable.  Mom does not have a pump at home, but does have WIC.  LC set up the DEBP and sized Mom with 21 mm flanges.  Educated on initiation setting and proper washing and drying of pump parts.  Mom encouraged to ask for RN to assist with first pumping when she is ready.   Plan recommended using interpreter- 1- STS with baby as much as possible 2- Offer the breast with feeding cues, making sure baby is latched with a deep wide latch 3- Pump both breasts after breastfeeding or attempts 4- supplement with EBM +/formula by bottle 5- ask for help prn  Maternal Data Has patient been taught Hand Expression?: Yes Does the patient have breastfeeding experience prior to this delivery?: Yes How long did the patient breastfeed?: 6 months breast and formula fed  Feeding Mother's Current Feeding Choice: Breast Milk and Formula Nipple Type: Slow - flow  LATCH Score Latch: Too sleepy or reluctant, no latch  achieved, no sucking elicited.  Audible Swallowing: None  Type of Nipple: Everted at rest and after stimulation  Comfort (Breast/Nipple): Soft / non-tender  Hold (Positioning): Full assist, staff holds infant at breast  LATCH Score: 4   Lactation Tools Discussed/Used Tools: Pump;Flanges;Bottle Flange Size: 21 Breast pump type: Double-Electric Breast Pump Pump Education: Setup, frequency, and cleaning;Milk Storage Reason for Pumping: support milk supply/infant being supplemented by formula/mother with PPH EBL 1200 cc Pumping frequency: Mom holding baby STS currently, teaching done and encouraged Mom to pump after feeding baby or attempts at the breast.  Interventions Interventions: Breast feeding basics reviewed;Skin to skin;Assisted with latch;Breast massage;Hand express;Support pillows;Adjust position;DEBP;Education;LC Services brochure;CDC Guidelines for Breast Pump Cleaning  Discharge WIC Program: Yes  Consult Status Consult Status: Follow-up Date: 01/14/24 Follow-up type: In-patient    Claudene Aleck BRAVO 01/13/2024, 11:39 AM

## 2024-01-14 ENCOUNTER — Encounter: Payer: Self-pay | Admitting: Obstetrics and Gynecology

## 2024-01-14 ENCOUNTER — Other Ambulatory Visit (HOSPITAL_COMMUNITY): Payer: Self-pay

## 2024-01-14 MED ORDER — ENOXAPARIN SODIUM 100 MG/ML IJ SOSY
100.0000 mg | PREFILLED_SYRINGE | Freq: Two times a day (BID) | INTRAMUSCULAR | 2 refills | Status: DC
  Filled 2024-01-14: qty 20, 10d supply, fill #0
  Filled 2024-01-14: qty 39, 20d supply, fill #0

## 2024-01-14 MED ORDER — IBUPROFEN 600 MG PO TABS
600.0000 mg | ORAL_TABLET | Freq: Four times a day (QID) | ORAL | 0 refills | Status: DC
  Filled 2024-01-14: qty 30, 8d supply, fill #0

## 2024-01-14 MED ORDER — FERROUS SULFATE 325 (65 FE) MG PO TABS
325.0000 mg | ORAL_TABLET | Freq: Every day | ORAL | 3 refills | Status: DC
  Filled 2024-01-14: qty 30, 30d supply, fill #0

## 2024-01-14 NOTE — Plan of Care (Signed)
  Problem: Education: Goal: Knowledge of General Education information will improve Description: Including pain rating scale, medication(s)/side effects and non-pharmacologic comfort measures Outcome: Adequate for Discharge   Problem: Health Behavior/Discharge Planning: Goal: Ability to manage health-related needs will improve Outcome: Adequate for Discharge   Problem: Clinical Measurements: Goal: Ability to maintain clinical measurements within normal limits will improve Outcome: Adequate for Discharge Goal: Will remain free from infection Outcome: Adequate for Discharge Goal: Diagnostic test results will improve Outcome: Adequate for Discharge Goal: Respiratory complications will improve Outcome: Adequate for Discharge Goal: Cardiovascular complication will be avoided Outcome: Adequate for Discharge   Problem: Activity: Goal: Risk for activity intolerance will decrease Outcome: Adequate for Discharge   Problem: Nutrition: Goal: Adequate nutrition will be maintained Outcome: Adequate for Discharge   Problem: Coping: Goal: Level of anxiety will decrease Outcome: Adequate for Discharge   Problem: Elimination: Goal: Will not experience complications related to bowel motility Outcome: Adequate for Discharge Goal: Will not experience complications related to urinary retention Outcome: Adequate for Discharge   Problem: Pain Managment: Goal: General experience of comfort will improve and/or be controlled Outcome: Adequate for Discharge   Problem: Safety: Goal: Ability to remain free from injury will improve Outcome: Adequate for Discharge   Problem: Skin Integrity: Goal: Risk for impaired skin integrity will decrease Outcome: Adequate for Discharge   Problem: Education: Goal: Knowledge of condition will improve Outcome: Adequate for Discharge Goal: Individualized Educational Video(s) Outcome: Adequate for Discharge Goal: Individualized Newborn Educational  Video(s) Outcome: Adequate for Discharge   Problem: Activity: Goal: Will verbalize the importance of balancing activity with adequate rest periods Outcome: Adequate for Discharge Goal: Ability to tolerate increased activity will improve Outcome: Adequate for Discharge   Problem: Coping: Goal: Ability to identify and utilize available resources and services will improve Outcome: Adequate for Discharge   Problem: Life Cycle: Goal: Chance of risk for complications during the postpartum period will decrease Outcome: Adequate for Discharge   Problem: Role Relationship: Goal: Ability to demonstrate positive interaction with newborn will improve Outcome: Adequate for Discharge   Problem: Skin Integrity: Goal: Demonstration of wound healing without infection will improve Outcome: Adequate for Discharge

## 2024-01-14 NOTE — Lactation Note (Signed)
 This note was copied from a baby's chart. Lactation Consultation Note  Patient Name: Lisa Massey Date: 01/14/2024 Age:34 hours Reason for consult: Follow-up assessment;Term;Infant weight loss (WL -4.74%)  Visited with family of 25 55/70 weeks old female; Lisa Massey is a P4 and experienced breastfeeding. She has been taking baby to breast but mainly supplementing with Similac 20 calorie formula. She has been pumping in the meantime and getting enough colostrum to collect, praised her for all her efforts. Couplet is getting discharged today. Reviewed discharge education and the importance of consistently emptying her breast ideally with baby's mouth or through pumping for the onset of lactogenesis II and the prevention of engorgement. She politely declined a referral for LC OP F/U and voiced she'll be F/U with the local [facility] office for lactation care and possibly to get a pump (declined loaner pump). Encouraged to put baby to breast +8 times/24 hours or sooner if feeding cues are present, she was also encouraged to pump whenever baby is getting a bottle; showed her how to use th hand pump. Visitors present. All questions and concerns answered, family to contact [facility] services PRN.  Feeding Mother's Current Feeding Choice: Breast Milk and Formula Nipple Type: Slow - flow  Lactation Tools Discussed/Used Tools: Pump;Flanges Flange Size: 21 Breast pump type: Double-Electric Breast Pump Pump Education: Setup, frequency, and cleaning;Milk Storage Reason for Pumping: Support milk supply, formula suplementation, PPH of 1274 Pumping frequency: 4 times/24 hours Pumped volume: 1 mL  Interventions Interventions: Breast feeding basics reviewed;DEBP;Education  Discharge Discharge Education: Engorgement and breast care;Warning signs for feeding baby;Outpatient recommendation  Consult Status Consult Status: Complete Date: 01/14/24 Follow-up type: Call as needed   Lisa Massey Crate 01/14/2024, 1:21 PM

## 2024-01-23 ENCOUNTER — Telehealth (HOSPITAL_COMMUNITY): Payer: Self-pay | Admitting: *Deleted

## 2024-01-23 NOTE — Telephone Encounter (Signed)
 01/23/2024  Name: Lisa Massey MRN: [id number] DOB: 01/13/1991  Reason for Call:  Transition of Care Hospital Discharge Call  Contact Status: Patient Contact Status: Complete  Language assistant needed: Interpreter Mode: Telephonic Interpreter Interpreter Name: Carlisle  #[id number] Interpreter Phone Number - If applicable: Language Line [phone]        Follow-Up Questions: Do You Have Any Concerns About Your Health As You Heal From Delivery?: Yes What Concerns Do You Have About Your Health?: Patient stated, My bones hurt and my (L) foot hurts. Patient continued to explain that she feels pain in her bones from her ankle up in her (L) leg. Patient denied swelling in her (L) foot. Denied redness, tenderness in (L) leg. RN instructed patient to contact her MD to discuss her concern. Patient voiced no other questions or concerns at this time. Do You Have Any Concerns About Your Infants Health?: Yes What Concerns Do You Have About Your Baby?: Patient reported that infant has a blocked tear duct. Stated that infant has been evaluated by pediatrician. Reported that infant's eye is improving.  Voiced no other questions or concerns at this time.  Edinburgh Postnatal Depression Scale:  In the Past 7 Days: I have been able to laugh and see the funny side of things.: As much as I always could I have looked forward with enjoyment to things.: As much as I ever did I have blamed myself unnecessarily when things went wrong.: No, never I have been anxious or worried for no good reason.: No, not at all I have felt scared or panicky for no good reason.: No, not at all Things have been getting on top of me.: No, I have been coping as well as ever I have been so unhappy that I have had difficulty sleeping.: Not at all I have felt sad or miserable.: No, not at all I have been so unhappy that I have been crying.: No, never The thought of harming myself has occurred to me.: Never Edinburgh Postnatal  Depression Scale Total: 0  PHQ2-9 Depression Scale:     Discharge Follow-up: Edinburgh score requires follow up?: No Patient referred to:: OB  Post-discharge interventions: Reviewed Newborn Safe Sleep Practices  Signature Allean IVAR Carton, RN, 01/23/24, [phone]

## 2024-02-20 ENCOUNTER — Other Ambulatory Visit: Payer: Self-pay

## 2024-02-20 ENCOUNTER — Encounter: Payer: Self-pay | Admitting: Obstetrics and Gynecology

## 2024-02-20 ENCOUNTER — Ambulatory Visit: Payer: Self-pay | Admitting: Obstetrics and Gynecology

## 2024-02-20 DIAGNOSIS — Z3009 Encounter for other general counseling and advice on contraception: Secondary | ICD-10-CM

## 2024-02-20 DIAGNOSIS — Z758 Other problems related to medical facilities and other health care: Secondary | ICD-10-CM

## 2024-02-20 DIAGNOSIS — Z86718 Personal history of other venous thrombosis and embolism: Secondary | ICD-10-CM | POA: Diagnosis not present

## 2024-02-20 DIAGNOSIS — Z8759 Personal history of other complications of pregnancy, childbirth and the puerperium: Secondary | ICD-10-CM

## 2024-02-20 HISTORY — DX: Encounter for other general counseling and advice on contraception: Z30.09

## 2024-02-20 NOTE — Progress Notes (Signed)
 Post Partum Visit Note  Lisa Massey is a 34 y.o. [phone] female who presents for a postpartum visit. She is 5 weeks postpartum following a normal spontaneous vaginal delivery.  I have fully reviewed the prenatal and intrapartum course. The delivery was at 39 gestational weeks.  Anesthesia: epidural. Postpartum course has been good. Baby is doing well. Baby is feeding by bottle - Similac Advance. Bleeding no bleeding. Bowel function is normal. Bladder function is normal. Patient is not sexually active. Contraception method is unsure. Postpartum depression screening: negative.   The pregnancy intention screening data noted above was reviewed. Potential methods of contraception were discussed. The patient elected to proceed with desires BTL    Health Maintenance Due  Topic Date Due   Hepatitis B Vaccines 19-59 Average Risk (1 of 3 - 19+ 3-dose series) Never done   HPV VACCINES (1 - 3-dose SCDM series) Never done   Influenza Vaccine  12/28/2023   COVID-19 Vaccine (1 - 2024-25 season) Never done    The following portions of the patient's history were reviewed and updated as appropriate: allergies, current medications, past family history, past medical history, past social history, past surgical history, and problem list.  Review of Systems Pertinent items are noted in HPI.  Objective:  LMP 03/18/2023    General:  alert and cooperative   Breasts:  not indicated  Lungs: Normal effort   Heart:  Normal rate  Abdomen: Nondistended    Wound N/a  GU exam:  not indicated       Assessment:   1. Postpartum exam (Primary)   2. History of DVT (deep vein thrombosis) On lovenox  - Ambulatory Referral to Primary Care  3. Does not have primary care provider  - Ambulatory Referral to Primary Care  4. Unwanted fertility Desires BTL, will schedule for consult    Plan:   Essential components of care per ACOG recommendations:  1.  Mood and well being: Patient with negative  depression screening today. Reviewed local resources for support.  - Patient tobacco use? No.   - hx of drug use? No.    2. Infant care and feeding:  -Patient currently breastmilk feeding? No.  -Social determinants of health (SDOH) reviewed in EPIC. No concerns  3. Sexuality, contraception and birth spacing - Patient does not want a pregnancy in the next year.  Desired family size is 4 children.  - Reviewed reproductive life planning. Reviewed contraceptive methods based on pt preferences and effectiveness.  Patient desired Female Condom today.   - Discussed birth spacing of 18 months  4. Sleep and fatigue -Encouraged family/partner/community support of 4 hrs of uninterrupted sleep to help with mood and fatigue  5. Physical Recovery  - Discussed patients delivery and complications. She describes her labor as good. - Patient had a vaginal delivery with PPH. Patient had a  laceration. Perineal healing reviewed. Patient expressed understanding - Patient has urinary incontinence? No. - Patient is safe to resume physical and sexual activity  6.  Health Maintenance - HM due items addressed Yes - Last pap smear  Diagnosis  Date Value Ref Range Status  02/21/2022   Final   - Negative for Intraepithelial Lesions or Malignancy (NILM)  02/21/2022 - Benign reactive/reparative changes  Final   Pap smear not done at today's visit.  -Breast Cancer screening indicated? No.   7. Chronic Disease/Pregnancy Condition follow up:   - PCP follow up  Nidia Daring, FNP Center for [facility], [facility]

## 2024-03-26 ENCOUNTER — Other Ambulatory Visit: Payer: Self-pay

## 2024-03-26 ENCOUNTER — Ambulatory Visit (INDEPENDENT_AMBULATORY_CARE_PROVIDER_SITE_OTHER): Admitting: Obstetrics and Gynecology

## 2024-03-26 VITALS — BP 135/69 | HR 77 | Wt 189.0 lb

## 2024-03-26 DIAGNOSIS — Z758 Other problems related to medical facilities and other health care: Secondary | ICD-10-CM

## 2024-03-26 DIAGNOSIS — O2233 Deep phlebothrombosis in pregnancy, third trimester: Secondary | ICD-10-CM

## 2024-03-26 DIAGNOSIS — Z0000 Encounter for general adult medical examination without abnormal findings: Secondary | ICD-10-CM

## 2024-03-26 DIAGNOSIS — Z86718 Personal history of other venous thrombosis and embolism: Secondary | ICD-10-CM | POA: Diagnosis not present

## 2024-03-26 DIAGNOSIS — Z603 Acculturation difficulty: Secondary | ICD-10-CM

## 2024-03-26 DIAGNOSIS — Z01818 Encounter for other preprocedural examination: Secondary | ICD-10-CM

## 2024-03-26 DIAGNOSIS — Z3009 Encounter for other general counseling and advice on contraception: Secondary | ICD-10-CM

## 2024-03-26 NOTE — Progress Notes (Signed)
 Sterilization Consent form signed with patient and form filed in cabinet.    [name], RN 03/26/24

## 2024-03-26 NOTE — Progress Notes (Signed)
 GYNECOLOGY VISIT  Patient name: Lisa Massey MRN [id number]  Date of birth: 1991-05-20 Chief Complaint:   BTL Consult  History:  Murray Sheela Ewings here to discuss having TL surgery. Certain has completed childbearing. Did not hear from [facility] about scheduling appointment to discuss DVT that occurred in pregnancy. Has completed lovenox  for DVT.     The following portions of the patient's history were reviewed and updated as appropriate: allergies, current medications, past family history, past medical history, past social history, past surgical history and problem list.   Health Maintenance:   Last pap     Component Value Date/Time   DIAGPAP  02/21/2022 1450    - Negative for Intraepithelial Lesions or Malignancy (NILM)   DIAGPAP - Benign reactive/reparative changes 02/21/2022 1450   DIAGPAP (A) 11/24/2020 0909    - Atypical squamous cells of undetermined significance (ASC-US )   HPVHIGH Negative 11/24/2020 0909   ADEQPAP  02/21/2022 1450    Satisfactory for evaluation; transformation zone component PRESENT.   ADEQPAP  11/24/2020 0909    Satisfactory for evaluation; transformation zone component PRESENT.    Health Maintenance  Topic Date Due   Hepatitis B Vaccine (1 of 3 - 19+ 3-dose series) Never done   HPV Vaccine (1 - 3-dose SCDM series) Never done   Flu Shot  12/28/2023   COVID-19 Vaccine (1 - 2025-26 season) Never done   Pap with HPV screening  02/22/2027   DTaP/Tdap/Td vaccine (2 - Td or Tdap) 10/16/2033   Hepatitis C Screening  Completed   HIV Screening  Completed   Pneumococcal Vaccine  Aged Out   Meningitis B Vaccine  Aged Out      Review of Systems:  Pertinent items are noted in HPI. Comprehensive review of systems was otherwise negative.   Objective:  Physical Exam BP 135/69   Pulse 77   Wt 189 lb (85.7 kg)   LMP 02/26/2024 (Exact Date)   Breastfeeding No   BMI 34.57 kg/m    Physical Exam Vitals and nursing note reviewed.  Constitutional:       Appearance: Normal appearance.  HENT:     Head: Normocephalic and atraumatic.  Pulmonary:     Effort: Pulmonary effort is normal.  Skin:    General: Skin is warm and dry.  Neurological:     General: No focal deficit present.     Mental Status: She is alert.  Psychiatric:        Mood and Affect: Mood normal.        Behavior: Behavior normal.        Thought Content: Thought content normal.        Judgment: Judgment normal.          Assessment & Plan:  1. History of DVT (deep vein thrombosis) (Primary) Recommend establishing care with PCP - unclear if prior referral went through, placed referral to heme/onc for further evaluation including perioperative anticoagulation management.  - Ambulatory referral to Hematology / Oncology - Ambulatory referral to [facility]  2. Deep vein thrombosis (DVT) affecting pregnancy in third trimester Will likely need perioperative anti-coagulation given VTE in most recent pregnancy. Discussed risks of bleeding vs repeat VTE.  - Ambulatory referral to Hematology / Oncology  3. Unwanted fertility She desires permanent sterilization. Discussed alternatives including LARC options and vasectomy. She declines these options. Discussed surgery of salpingectomy vs tubal ligation. She would like to do a salpingectomy.  Risks of surgery include but are not limited to: bleeding,  infection, injury to surrounding organs/tissues (i.e. bowel/bladder/ureters), need for additional procedures, wound complications, hospital re-admission, regret,  conversion to open surgery, and VTE.  Reviewed restrictions and recovery following surgery   4. Preop examination - Ambulatory referral to Hematology / Oncology  5. Healthcare maintenance  - Ambulatory referral to [facility]  6. Language barrier In person interpreter used for encounter   Charly Hunton, MD Minimally Invasive Gynecologic Surgery Center for [facility], [facility]

## 2024-03-27 ENCOUNTER — Encounter: Payer: Self-pay | Admitting: *Deleted

## 2024-03-27 NOTE — Progress Notes (Signed)
 Contacted [facility] on [address] at [phone] and was informed that they would reach out to the pt to get her scheduled for a new patient appt.    Quasim Doyon,RN  03/27/24

## 2024-04-01 ENCOUNTER — Telehealth: Payer: Self-pay

## 2024-04-01 NOTE — Telephone Encounter (Signed)
 Used interpreter services([employer], PI#[id number],) to call patient to schedule surgery w/ Dr. Jeralyn on 05/12/24 @11  am at [facility]. Patient agreed to surgery details. Pre-op instructions were provided by phone.

## 2024-05-09 ENCOUNTER — Other Ambulatory Visit: Payer: Self-pay

## 2024-05-09 ENCOUNTER — Encounter (HOSPITAL_COMMUNITY): Payer: Self-pay | Admitting: Obstetrics and Gynecology

## 2024-05-09 NOTE — Progress Notes (Signed)
 SDW CALL  Patient was given pre-op instructions over the phone. The opportunity was given for the patient to ask questions. No further questions asked. Patient verbalized understanding of instructions given.   PCP - none;referred to [facility] by [facility] Cardiologist - none  PPM/ICD - denies Device Orders -  Rep Notified -   Chest x-ray -  EKG - 12/18/23 Stress Test -  ECHO -  Cardiac Cath -   Fasting Blood Sugar - na Checks Blood Sugar na_____ times a day  Blood Thinner Instructions:na Aspirin Instructions:na  ERAS Protcol -clears until 0430 PRE-SURGERY Ensure or G2- na  COVID TEST- na   Anesthesia review: yes- history of DVT during pregnancy in 12/2023;completed Lovenox   Patient denies shortness of breath, fever, cough and chest pain over the phone call   Special instructions:    Oral Hygiene is also important to reduce your risk of infection.  Remember - BRUSH YOUR TEETH THE MORNING OF SURGERY WITH YOUR REGULAR TOOTHPASTE

## 2024-05-09 NOTE — Anesthesia Preprocedure Evaluation (Addendum)
 Anesthesia Evaluation  Patient identified by MRN, date of birth, ID band Patient awake    Reviewed: Allergy & Precautions, NPO status , Patient's Chart, lab work & pertinent test results  History of Anesthesia Complications Negative for: history of anesthetic complications  Airway Mallampati: II  TM Distance: >3 FB Neck ROM: Full    Dental no notable dental hx.    Pulmonary neg pulmonary ROS   Pulmonary exam normal        Cardiovascular negative cardio ROS Normal cardiovascular exam     Neuro/Psych negative neurological ROS     GI/Hepatic negative GI ROS, Neg liver ROS,,,  Endo/Other  negative endocrine ROS    Renal/GU negative Renal ROS     Musculoskeletal negative musculoskeletal ROS (+)    Abdominal   Peds  Hematology negative hematology ROS (+)   Anesthesia Other Findings   Reproductive/Obstetrics Undesired fertility                              Anesthesia Physical Anesthesia Plan  ASA: 2  Anesthesia Plan: General   Post-op Pain Management: Tylenol  PO (pre-op)* and Toradol  IV (intra-op)*   Induction: Intravenous  PONV Risk Score and Plan: 4 or greater and Treatment may vary due to age or medical condition, Ondansetron , Dexamethasone , Midazolam  and Scopolamine patch - Pre-op  Airway Management Planned: Oral ETT  Additional Equipment: None  Intra-op Plan:   Post-operative Plan: Extubation in OR  Informed Consent: I have reviewed the patients History and Physical, chart, labs and discussed the procedure including the risks, benefits and alternatives for the proposed anesthesia with the patient or authorized representative who has indicated his/her understanding and acceptance.     Dental advisory given and Interpreter used for interview  Plan Discussed with: CRNA  Anesthesia Plan Comments: (33 year old Spanish speaking female. S/p admission for pyelonephritis with  diagnosis of RLE DVT 12/18/2023 while [redacted] weeks pregnant. Was on Lovenox  until delivery 01/12/2024. Now desires salpingectomy for permanent sterilization. Notes suggest Dr. Jeralyn placed ambulatory referral to [facility] Dr. Federico for post-operative DVT prophylaxis recommendations, but visit not scheduled until 05/15/2024. If case remains as scheduled then Dr. Jeralyn could consider post-operative HEM or Hospitalist communication or consultation if she fells this is indicated.     Isaiah Ruder, PA-C Surgical Short Stay/Anesthesiology [facility] Phone [phone] 05/09/2024 5:27 PM   )         Anesthesia Quick Evaluation

## 2024-05-12 ENCOUNTER — Ambulatory Visit (HOSPITAL_BASED_OUTPATIENT_CLINIC_OR_DEPARTMENT_OTHER): Payer: Self-pay | Admitting: Vascular Surgery

## 2024-05-12 ENCOUNTER — Other Ambulatory Visit: Payer: Self-pay

## 2024-05-12 ENCOUNTER — Ambulatory Visit (HOSPITAL_COMMUNITY)
Admission: RE | Admit: 2024-05-12 | Discharge: 2024-05-12 | Disposition: A | Attending: Obstetrics and Gynecology | Admitting: Obstetrics and Gynecology

## 2024-05-12 ENCOUNTER — Encounter (HOSPITAL_COMMUNITY): Payer: Self-pay | Admitting: Obstetrics and Gynecology

## 2024-05-12 ENCOUNTER — Ambulatory Visit (HOSPITAL_COMMUNITY): Payer: Self-pay | Admitting: Vascular Surgery

## 2024-05-12 ENCOUNTER — Encounter (HOSPITAL_COMMUNITY): Admission: RE | Disposition: A | Payer: Self-pay | Source: Home / Self Care | Attending: Obstetrics and Gynecology

## 2024-05-12 ENCOUNTER — Other Ambulatory Visit (HOSPITAL_COMMUNITY): Payer: Self-pay

## 2024-05-12 DIAGNOSIS — Z3009 Encounter for other general counseling and advice on contraception: Secondary | ICD-10-CM

## 2024-05-12 DIAGNOSIS — Z302 Encounter for sterilization: Secondary | ICD-10-CM

## 2024-05-12 HISTORY — PX: LAPAROSCOPIC BILATERAL SALPINGECTOMY: SHX5889

## 2024-05-12 LAB — CBC
HCT: 32.5 % — ABNORMAL LOW (ref 36.0–46.0)
Hemoglobin: 10.1 g/dL — ABNORMAL LOW (ref 12.0–15.0)
MCH: 23.2 pg — ABNORMAL LOW (ref 26.0–34.0)
MCHC: 31.1 g/dL (ref 30.0–36.0)
MCV: 74.7 fL — ABNORMAL LOW (ref 80.0–100.0)
Platelets: 304 K/uL (ref 150–400)
RBC: 4.35 MIL/uL (ref 3.87–5.11)
RDW: 14.3 % (ref 11.5–15.5)
WBC: 8.1 K/uL (ref 4.0–10.5)
nRBC: 0 % (ref 0.0–0.2)

## 2024-05-12 LAB — POCT PREGNANCY, URINE: Preg Test, Ur: NEGATIVE

## 2024-05-12 SURGERY — SALPINGECTOMY, BILATERAL, LAPAROSCOPIC
Anesthesia: General | Site: Pelvis | Laterality: Bilateral

## 2024-05-12 MED ORDER — ONDANSETRON HCL 4 MG/2ML IJ SOLN
INTRAMUSCULAR | Status: AC
  Filled 2024-05-12: qty 2

## 2024-05-12 MED ORDER — OXYCODONE HCL 5 MG PO TABS: 5.0000 mg | ORAL_TABLET | Freq: Once | ORAL | Status: DC | PRN

## 2024-05-12 MED ORDER — MIDAZOLAM HCL 5 MG/5ML IJ SOLN
INTRAMUSCULAR | Status: DC | PRN
  Administered 2024-05-12: 07:00:00 2 mg via INTRAVENOUS

## 2024-05-12 MED ORDER — ACETAMINOPHEN 10 MG/ML IV SOLN
INTRAVENOUS | Status: AC
  Filled 2024-05-12: qty 100

## 2024-05-12 MED ORDER — ACETAMINOPHEN 500 MG PO TABS: 1000.0000 mg | ORAL_TABLET | ORAL | Status: DC

## 2024-05-12 MED ORDER — LACTATED RINGERS IV SOLN: INTRAVENOUS | Status: DC

## 2024-05-12 MED ORDER — FENTANYL CITRATE (PF) 100 MCG/2ML IJ SOLN
INTRAMUSCULAR | Status: AC
  Filled 2024-05-12: qty 2

## 2024-05-12 MED ORDER — OXYCODONE HCL 5 MG PO TABS
5.0000 mg | ORAL_TABLET | ORAL | 0 refills | Status: DC | PRN
  Filled 2024-05-12: qty 6, 1d supply, fill #0

## 2024-05-12 MED ORDER — PROPOFOL 10 MG/ML IV BOLUS
INTRAVENOUS | Status: AC
  Filled 2024-05-12: qty 20

## 2024-05-12 MED ORDER — LIDOCAINE 2% (20 MG/ML) 5 ML SYRINGE
INTRAMUSCULAR | Status: AC
  Filled 2024-05-12: qty 5

## 2024-05-12 MED ORDER — LIDOCAINE HCL (CARDIAC) PF 100 MG/5ML IV SOSY: PREFILLED_SYRINGE | INTRAVENOUS | Status: DC | PRN

## 2024-05-12 MED ORDER — DROPERIDOL 2.5 MG/ML IJ SOLN: 0.6250 mg | Freq: Once | INTRAMUSCULAR | Status: DC | PRN

## 2024-05-12 MED ORDER — KETOROLAC TROMETHAMINE 30 MG/ML IJ SOLN
INTRAMUSCULAR | Status: AC
  Filled 2024-05-12: qty 1

## 2024-05-12 MED ORDER — ENOXAPARIN SODIUM 40 MG/0.4ML IJ SOSY
40.0000 mg | PREFILLED_SYRINGE | INTRAMUSCULAR | 0 refills | Status: DC
  Filled 2024-05-12: qty 4, 10d supply, fill #0

## 2024-05-12 MED ORDER — LIDOCAINE 2% (20 MG/ML) 5 ML SYRINGE
INTRAMUSCULAR | Status: DC | PRN
  Administered 2024-05-12: 08:00:00 60 mg via INTRAVENOUS

## 2024-05-12 MED ORDER — PROPOFOL 10 MG/ML IV BOLUS
INTRAVENOUS | Status: DC | PRN
  Administered 2024-05-12: 08:00:00 50 mg via INTRAVENOUS
  Administered 2024-05-12: 08:00:00 150 mg via INTRAVENOUS

## 2024-05-12 MED ORDER — DEXAMETHASONE SOD PHOSPHATE PF 10 MG/ML IJ SOLN
INTRAMUSCULAR | Status: DC | PRN
  Administered 2024-05-12: 08:00:00 8 mg via INTRAVENOUS

## 2024-05-12 MED ORDER — KETOROLAC TROMETHAMINE 30 MG/ML IJ SOLN
INTRAMUSCULAR | Status: DC | PRN
  Administered 2024-05-12: 08:00:00 30 mg via INTRAVENOUS

## 2024-05-12 MED ORDER — CHLORHEXIDINE GLUCONATE 0.12 % MT SOLN
15.0000 mL | Freq: Once | OROMUCOSAL | Status: AC
  Administered 2024-05-12: 06:00:00 15 mL via OROMUCOSAL
  Filled 2024-05-12: qty 15

## 2024-05-12 MED ORDER — SUGAMMADEX SODIUM 200 MG/2ML IV SOLN
INTRAVENOUS | Status: AC
  Filled 2024-05-12: qty 2

## 2024-05-12 MED ORDER — BUPIVACAINE HCL (PF) 0.5 % IJ SOLN
INTRAMUSCULAR | Status: AC
  Filled 2024-05-12: qty 30

## 2024-05-12 MED ORDER — ROCURONIUM BROMIDE 100 MG/10ML IV SOLN
INTRAVENOUS | Status: DC | PRN
  Administered 2024-05-12: 08:00:00 50 mg via INTRAVENOUS

## 2024-05-12 MED ORDER — ROCURONIUM BROMIDE 10 MG/ML (PF) SYRINGE
PREFILLED_SYRINGE | INTRAVENOUS | Status: AC
  Filled 2024-05-12: qty 10

## 2024-05-12 MED ORDER — LACTATED RINGERS IV SOLN: INTRAVENOUS | Status: DC | PRN

## 2024-05-12 MED ORDER — FENTANYL CITRATE (PF) 100 MCG/2ML IJ SOLN
INTRAMUSCULAR | Status: DC | PRN
  Administered 2024-05-12 (×2): 50 ug via INTRAVENOUS

## 2024-05-12 MED ORDER — ORAL CARE MOUTH RINSE: 15.0000 mL | Freq: Once | OROMUCOSAL | Status: AC

## 2024-05-12 MED ORDER — OXYCODONE HCL 5 MG/5ML PO SOLN: 5.0000 mg | Freq: Once | ORAL | Status: DC | PRN

## 2024-05-12 MED ORDER — ACETAMINOPHEN 10 MG/ML IV SOLN
INTRAVENOUS | Status: DC | PRN
  Administered 2024-05-12: 08:00:00 1000 mg via INTRAVENOUS

## 2024-05-12 MED ORDER — FENTANYL CITRATE (PF) 100 MCG/2ML IJ SOLN: 25.0000 ug | INTRAMUSCULAR | Status: DC | PRN

## 2024-05-12 MED ORDER — IBUPROFEN 600 MG PO TABS
600.0000 mg | ORAL_TABLET | Freq: Four times a day (QID) | ORAL | 1 refills | Status: DC | PRN
  Filled 2024-05-12: qty 30, 8d supply, fill #0

## 2024-05-12 MED ORDER — ONDANSETRON HCL 4 MG/2ML IJ SOLN
INTRAMUSCULAR | Status: DC | PRN
  Administered 2024-05-12: 08:00:00 4 mg via INTRAVENOUS

## 2024-05-12 MED ORDER — MIDAZOLAM HCL 2 MG/2ML IJ SOLN
INTRAMUSCULAR | Status: AC
  Filled 2024-05-12: qty 2

## 2024-05-12 MED ORDER — 0.9 % SODIUM CHLORIDE (POUR BTL) OPTIME
TOPICAL | Status: DC | PRN
  Administered 2024-05-12: 08:00:00 1000 mL

## 2024-05-12 MED ORDER — BUPIVACAINE HCL (PF) 0.5 % IJ SOLN
INTRAMUSCULAR | Status: DC | PRN
  Administered 2024-05-12 (×2): 10 mL

## 2024-05-12 MED ORDER — ACETAMINOPHEN 500 MG PO TABS
500.0000 mg | ORAL_TABLET | Freq: Four times a day (QID) | ORAL | 0 refills | Status: DC | PRN
  Filled 2024-05-12: qty 60, 8d supply, fill #0

## 2024-05-12 MED ORDER — SUGAMMADEX SODIUM 200 MG/2ML IV SOLN
INTRAVENOUS | Status: DC | PRN
  Administered 2024-05-12: 08:00:00 200 mg via INTRAVENOUS

## 2024-05-12 MED ORDER — BUPIVACAINE LIPOSOME 1.3 % IJ SUSP
INTRAMUSCULAR | Status: AC
  Filled 2024-05-12: qty 20

## 2024-05-12 SURGICAL SUPPLY — 35 items
CNTNR URN SCR LID CUP LEK RST (MISCELLANEOUS) ×1 IMPLANT
COVER MAYO STAND STRL (DRAPES) ×1 IMPLANT
DERMABOND ADVANCED .7 DNX12 (GAUZE/BANDAGES/DRESSINGS) ×1 IMPLANT
DRAPE SURG IRRIG POUCH 19X23 (DRAPES) ×1 IMPLANT
DURAPREP 26ML APPLICATOR (WOUND CARE) ×1 IMPLANT
ELECTRODE REM PT RTRN 9FT ADLT (ELECTROSURGICAL) ×1 IMPLANT
GAUZE 4X4 16PLY ~~LOC~~+RFID DBL (SPONGE) IMPLANT
GLOVE BIOGEL PI IND STRL 7.0 (GLOVE) ×4 IMPLANT
GLOVE BIOGEL PI MICRO STRL 7 (GLOVE) ×1 IMPLANT
GOWN STRL REUS W/ TWL LRG LVL3 (GOWN DISPOSABLE) ×1 IMPLANT
GOWN STRL REUS W/ TWL XL LVL3 (GOWN DISPOSABLE) ×2 IMPLANT
IRRIGATION SUCT STRKRFLW 2 WTP (MISCELLANEOUS) IMPLANT
KIT PINK PAD W/HEAD ARM REST (MISCELLANEOUS) ×1 IMPLANT
KIT TURNOVER KIT B (KITS) ×1 IMPLANT
LIGASURE BLUNT TIP 5 LONG 44CM (ELECTROSURGICAL) IMPLANT
MANIFOLD NEPTUNE II (INSTRUMENTS) IMPLANT
NDL INSUFFLATION 14GA 120MM (NEEDLE) IMPLANT
NDL SPNL 22GX3.5 QUINCKE BK (NEEDLE) ×1 IMPLANT
NEEDLE INSUFFLATION 14GA 120MM (NEEDLE) IMPLANT
NEEDLE SPNL 22GX3.5 QUINCKE BK (NEEDLE) ×1 IMPLANT
PACK LAPAROSCOPY BASIN (CUSTOM PROCEDURE TRAY) ×1 IMPLANT
PAD OB MATERNITY 11 LF (PERSONAL CARE ITEMS) ×1 IMPLANT
POUCH LAPAROSCOPIC INSTRUMENT (MISCELLANEOUS) ×1 IMPLANT
POWDER SURGICEL 3.0 GRAM (HEMOSTASIS) IMPLANT
SET TUBE SMOKE EVAC HIGH FLOW (TUBING) ×1 IMPLANT
SHEARS HARMONIC 36 ACE (MISCELLANEOUS) IMPLANT
SLEEVE SCD COMPRESS KNEE MED (STOCKING) ×1 IMPLANT
SLEEVE Z-THREAD 5X100MM (TROCAR) ×2 IMPLANT
SOLN 0.9% NACL POUR BTL 1000ML (IV SOLUTION) ×1 IMPLANT
SUT VIC AB 4-0 PS2 18 (SUTURE) ×1 IMPLANT
SYSTEM BAG RETRIEVAL 10MM (BASKET) IMPLANT
TIP ENDOSCOPIC SURGICEL (TIP) IMPLANT
TRAY FOLEY W/BAG SLVR 14FR LF (SET/KITS/TRAYS/PACK) ×1 IMPLANT
TROCAR Z-THREAD FIOS 5X100MM (TROCAR) ×1 IMPLANT
WARMER LAPAROSCOPE (MISCELLANEOUS) ×1 IMPLANT

## 2024-05-12 NOTE — CV Procedure (Deleted)
 Lisa Massey PROCEDURE DATE: 05/12/2024  PREOPERATIVE DIAGNOSIS: undesired fertility  POSTOPERATIVE DIAGNOSIS: undesired fertility PROCEDURE:    laparoscopic bilateral salpingectomy SURGEON: Finesse Fielder, MD ASSISTANT:  Jorene Moats, PA    An experienced assistant was required given the standard of surgical care given the complexity of the case.  This assistant was needed for exposure, dissection, suctioning, retraction, instrument exchange, and for overall help during the procedure.  INDICATIONS: 33 y.o. [id number] with undesired fertility.  Risks of surgery were discussed with the patient including but not limited to: bleeding which may require transfusion; infection which may require antibiotics; injury to surrounding organs; need for additional procedures including laparotomy;  and other postoperative/anesthesia complications. Written informed consent was obtained.    FINDINGS:  Normal external genitalia, 10 wk size mobile uterus with Normal contours.  Laparoscopically: normal upper abdominal survey, normal appearing uterus, normal bilateral fallopian tubes, normal bilateral ovaries, normal anterior cul de sac, normal posterior cul de sac   ANESTHESIA: General, paracervical block INTRAVENOUS FLUIDS:  670 ml of LR ESTIMATED BLOOD LOSS:  10 ml URINE OUTPUT: 600 ml SPECIMENS: bilateral fallopian tubes COMPLICATIONS:  None immediate.  PROCEDURE:  The patient was taken to the operating room where general anesthesia was obtained without difficulty.  She was then placed in the dorsal lithotomy position and prepared and draped in sterile fashion.  After an adequate timeout was performed, a bivalved speculum was then placed in the patient's vagina, and the anterior lip of cervix grasped with the single-tooth tenaculum.  The uterine manipulator was then advanced into the uterus.  The speculum was removed from the vagina.  Attention was then turned to the patient's abdomen where a 5-mm skin  incision was made in the umbilical fold.  The 5-mm trocar and sleeve were then advanced without difficulty with the laparoscope under direct visualization into the abdomen.  The abdomen was then insufflated with carbon dioxide gas.  Adequate pneumoperitoneum was obtained.  A survey of the patient's pelvis and abdomen revealed the findings above.  Bilateral 5-mm lower quadrant ports  were then placed under direct visualization.  The fallopian tubes were transected from the uterine attachments and the underlying mesosalpinx with the Ligasure device allowing for bilateral salpingectomy.  The fallopian tubes were then removed from the abdomen under direct visualization.  The operative site was surveyed, and it was found to be hemostatic.   No intraoperative injury to other surrounding organs was noted.  The abdomen was desufflated and all instruments were then removed from the patient's abdomen.  All skin incisions were closed with 4-0 vicryl. They were also covered with dermabond.  The uterine manipulator was removed from the vagina without complications. The patient tolerated the procedure well.  Sponge, lap, and needle counts were correct times two.  The patient was then taken to the recovery room awake, extubated and in stable condition.   Carter Quarry, MD Minimally Invasive Gynecologic Surgery  Obstetrics and Gynecology, [facility] for [facility], [facility] 05/12/2024

## 2024-05-12 NOTE — Op Note (Addendum)
 Lisa Massey PROCEDURE DATE: 05/12/2024  PREOPERATIVE DIAGNOSIS: undesired fertility  POSTOPERATIVE DIAGNOSIS: undesired fertility PROCEDURE:    laparoscopic bilateral salpingectomy SURGEON: Finesse Fielder, MD ASSISTANT:  Jorene Moats, PA    An experienced assistant was required given the standard of surgical care given the complexity of the case.  This assistant was needed for exposure, dissection, suctioning, retraction, instrument exchange, and for overall help during the procedure.  INDICATIONS: 33 y.o. [id number] with undesired fertility.  Risks of surgery were discussed with the patient including but not limited to: bleeding which may require transfusion; infection which may require antibiotics; injury to surrounding organs; need for additional procedures including laparotomy;  and other postoperative/anesthesia complications. Written informed consent was obtained.    FINDINGS:  Normal external genitalia, 10 wk size mobile uterus with Normal contours.  Laparoscopically: normal upper abdominal survey, normal appearing uterus, normal bilateral fallopian tubes, normal bilateral ovaries, normal anterior cul de sac, normal posterior cul de sac   ANESTHESIA: General, paracervical block INTRAVENOUS FLUIDS:  670 ml of LR ESTIMATED BLOOD LOSS:  10 ml URINE OUTPUT: 600 ml SPECIMENS: bilateral fallopian tubes COMPLICATIONS:  None immediate.  PROCEDURE:  The patient was taken to the operating room where general anesthesia was obtained without difficulty.  She was then placed in the dorsal lithotomy position and prepared and draped in sterile fashion.  After an adequate timeout was performed, a bivalved speculum was then placed in the patient's vagina, and the anterior lip of cervix grasped with the single-tooth tenaculum.  The uterine manipulator was then advanced into the uterus.  The speculum was removed from the vagina.  Attention was then turned to the patient's abdomen where a 5-mm skin  incision was made in the umbilical fold.  The 5-mm trocar and sleeve were then advanced without difficulty with the laparoscope under direct visualization into the abdomen.  The abdomen was then insufflated with carbon dioxide gas.  Adequate pneumoperitoneum was obtained.  A survey of the patient's pelvis and abdomen revealed the findings above.  Bilateral 5-mm lower quadrant ports  were then placed under direct visualization.  The fallopian tubes were transected from the uterine attachments and the underlying mesosalpinx with the Ligasure device allowing for bilateral salpingectomy.  The fallopian tubes were then removed from the abdomen under direct visualization.  The operative site was surveyed, and it was found to be hemostatic.   No intraoperative injury to other surrounding organs was noted.  The abdomen was desufflated and all instruments were then removed from the patient's abdomen.  All skin incisions were closed with 4-0 vicryl. They were also covered with dermabond.  The uterine manipulator was removed from the vagina without complications. The patient tolerated the procedure well.  Sponge, lap, and needle counts were correct times two.  The patient was then taken to the recovery room awake, extubated and in stable condition.   Carter Quarry, MD Minimally Invasive Gynecologic Surgery  Obstetrics and Gynecology, [facility] for [facility], [facility] 05/12/2024

## 2024-05-12 NOTE — H&P (Signed)
 OB/GYN Pre-Op History and Physical  Lisa Massey is a 33 y.o. [id number] presenting for [facility] BS.       Past Medical History:  Diagnosis Date   History of DVT (deep vein thrombosis) 12/18/2023   RLE   Medical history non-contributory     Past Surgical History:  Procedure Laterality Date   APPENDECTOMY     nexplanon       inserted 11/2018 per patient    OB History  Gravida Para Term Preterm AB Living  4 4 4   4   SAB IAB Ectopic Multiple Live Births     0 4    # Outcome Date GA Lbr Len/2nd Weight Sex Type Anes PTL Lv  4 Term 01/12/24 [redacted]w[redacted]d 01:07 / 00:03 3610 g M Vag-Spont EPI  LIV  3 Term 12/17/18 [redacted]w[redacted]d 15:55 / 00:17 4110 g M Vag-Spont EPI  LIV     Birth Comments: WNL  2 Term 04/28/15 [redacted]w[redacted]d 10:20 / 00:50 3941 g F Vag-Spont None  LIV  1 Term 05/22/12 [redacted]w[redacted]d  4196 g M Vag-Spont EPI  LIV    Social History   Socioeconomic History   Marital status: Single    Spouse name: Not on file   Number of children: Not on file   Years of education: Not on file   Highest education level: Not on file  Occupational History   Not on file  Tobacco Use   Smoking status: Never   Smokeless tobacco: Never  Vaping Use   Vaping status: Never Used  Substance and Sexual Activity   Alcohol use: No   Drug use: No   Sexual activity: Not Currently    Partners: Male    Birth control/protection: Surgical    Comment: She wants a tubal ligation  Other Topics Concern   Not on file  Social History Narrative   Not on file   Social Drivers of Health   Tobacco Use: Low Risk (05/12/2024)   Patient History    Smoking Tobacco Use: Never    Smokeless Tobacco Use: Never    Passive Exposure: Not on file  Financial Resource Strain: Not on file  Food Insecurity: Food Insecurity Present (03/26/2024)   Epic    Worried About Programme Researcher, Broadcasting/film/video in the Last Year: Sometimes true    [employer] of Food in the Last Year: Sometimes true  Transportation Needs: No Transportation Needs (01/12/2024)   Epic     Lack of Transportation (Medical): No    Lack of Transportation (Non-Medical): No  Physical Activity: Not on file  Stress: Not on file  Social Connections: Not on file  Depression (PHQ2-9): Low Risk (03/26/2024)   Depression (PHQ2-9)    PHQ-2 Score: 1  Alcohol Screen: Not on file  Housing: Low Risk (01/13/2024)   Epic    Unable to Pay for Housing in the Last Year: No    Number of Times Moved in the Last Year: 0    Homeless in the Last Year: No  Utilities: Not At Risk (01/12/2024)   Epic    Threatened with loss of utilities: No  Health Literacy: Not on file    Family History  Problem Relation Age of Onset   Hypertension Father    Cancer Sister        ovarian   Diabetes Maternal Grandmother    Asthma Neg Hx    Heart disease Neg Hx    Stroke Neg Hx     Medications Prior to Admission  Medication  Sig Dispense Refill Last Dose/Taking   acetaminophen  (TYLENOL ) 500 MG tablet Take 500-1,000 mg by mouth every 6 (six) hours as needed.   Past Week   Prenatal Vit-Fe Fumarate-FA (MULTIVITAMIN-PRENATAL) 27-0.8 MG TABS tablet Take 1 tablet by mouth daily in the afternoon.   05/11/2024    Allergies[1]  Review of Systems: Negative except for what is mentioned in HPI.     Physical Exam: BP 119/77   Pulse 74   Temp 97.9 F (36.6 C) (Oral)   Resp 18   Ht 5' 2 (1.575 m)   Wt 88 kg   LMP 04/24/2024   SpO2 97%   Breastfeeding No   BMI 35.48 kg/m  CONSTITUTIONAL: Well-developed, well-nourished and in no acute distress.  HENT:  Normocephalic, atraumatic, External right and left ear normal. Oropharynx is clear and moist EYES: Conjunctivae and EOM are normal. Pupils are equal, round, and reactive to light. No scleral icterus.  NECK: Normal range of motion, supple, no masses SKIN: Skin is warm and dry. No rash noted. Not diaphoretic. No erythema. No pallor. NEUROLGIC: Alert and oriented to person, place, and time. Normal reflexes, muscle tone coordination. No cranial nerve deficit  noted. PSYCHIATRIC: Normal mood and affect. Normal behavior. Normal judgment and thought content. RESPIRATORY: Normal effort PELVIC: Deferred   Pertinent Labs/Studies:   Results for orders placed or performed during the hospital encounter of 05/12/24 (from the past 72 hours)  CBC per protocol     Status: Abnormal   Collection Time: 05/12/24  5:49 AM  Result Value Ref Range   WBC 8.1 4.0 - 10.5 K/uL   RBC 4.35 3.87 - 5.11 MIL/uL   Hemoglobin 10.1 (L) 12.0 - 15.0 g/dL   HCT 67.4 (L) 63.9 - 53.9 %   MCV 74.7 (L) 80.0 - 100.0 fL   MCH 23.2 (L) 26.0 - 34.0 pg   MCHC 31.1 30.0 - 36.0 g/dL   RDW 85.6 88.4 - 84.4 %   Platelets 304 150 - 400 K/uL   nRBC 0.0 0.0 - 0.2 %    Comment: Performed at [facility], [address] [place], [place] [address]  Pregnancy, urine POC     Status: None   Collection Time: 05/12/24  6:27 AM  Result Value Ref Range   Preg Test, Ur NEGATIVE NEGATIVE    Comment:        THE SENSITIVITY OF THIS METHODOLOGY IS >20 mIU/mL.        Assessment and Plan :Lisa Massey is a 33 y.o. [phone] here for sterilization.   Patient desires surgical management with [facility].  The risks of surgery were discussed in detail with the patient including but not limited to: bleeding which may require transfusion or reoperation; infection which may require prolonged hospitalization or re-hospitalization and antibiotic therapy; injury to bowel, bladder, ureters and major vessels or other surrounding organs which may lead to other procedures; formation of adhesions; need for additional procedures including laparotomy or subsequent procedures secondary to intraoperative injury or abnormal pathology; thromboembolic phenomenon; incisional problems and other postoperative or anesthesia complications.  Patient was told that the likelihood that her condition and symptoms will be treated effectively with this surgical management was high; the postoperative expectations were also  discussed in detail. The patient also understands the alternative treatment options which were discussed in full. All questions were answered.     Yulisa Chirico, M.D. Minimally Invasive Gynecologic Surgery and Pelvic Pain Specialist Attending Obstetrician & Gynecologist, [facility], [facility]     [1] No Known Allergies

## 2024-05-12 NOTE — Transfer of Care (Signed)
 Immediate Anesthesia Transfer of Care Note  Patient: Lisa Massey  Procedure(s) Performed: SALPINGECTOMY, BILATERAL, LAPAROSCOPIC (Bilateral: Pelvis)  Patient Location: PACU  Anesthesia Type:General  Level of Consciousness: drowsy  Airway & Oxygen Therapy: Patient Spontanous Breathing and Patient connected to face mask oxygen  Post-op Assessment: Report given to RN and Post -op Vital signs reviewed and stable  Post vital signs: Reviewed and stable  Last Vitals:  Vitals Value Taken Time  BP 124/74 05/12/24 08:37  Temp    Pulse 88 05/12/24 08:40  Resp 17 05/12/24 08:40  SpO2 100 % 05/12/24 08:40  Vitals shown include unfiled device data.  Last Pain:  Vitals:   05/12/24 0620  TempSrc:   PainSc: 0-No pain         Complications: No notable events documented.

## 2024-05-12 NOTE — Brief Op Note (Signed)
 05/12/2024  8:24 AM  PATIENT:  Lisa Massey  33 y.o. female  PRE-OPERATIVE DIAGNOSIS:  Undesired fertility  POST-OPERATIVE DIAGNOSIS:  Undesired fertility  PROCEDURE:  Procedures: SALPINGECTOMY, BILATERAL, LAPAROSCOPIC (Bilateral)  SURGEON:  Surgeons and Role:    DEWAINE Jeralyn Crutch, MD - Primary  PHYSICIAN ASSISTANT: Jorene Moats, PA  ASSISTANTS: above   ANESTHESIA:   local, general, and paracervical block  EBL:  10 mL   BLOOD ADMINISTERED:none  DRAINS: Urinary Catheter (Foley)   LOCAL MEDICATIONS USED:  BUPIVICAINE   SPECIMEN:  Source of Specimen:  bilateral fallopian tubes  DISPOSITION OF SPECIMEN:  PATHOLOGY  COUNTS:  YES  TOURNIQUET:  * No tourniquets in log *  DICTATION: .Note written in EPIC  PLAN OF CARE: Discharge to home after PACU  PATIENT DISPOSITION:  PACU - hemodynamically stable.   Delay start of Pharmacological VTE agent (>24hrs) due to surgical blood loss or risk of bleeding: no

## 2024-05-12 NOTE — Anesthesia Postprocedure Evaluation (Signed)
 Anesthesia Post Note  Patient: Lisa Massey  Procedure(s) Performed: SALPINGECTOMY, BILATERAL, LAPAROSCOPIC (Bilateral: Pelvis)     Patient location during evaluation: PACU Anesthesia Type: General Level of consciousness: awake and alert Pain management: pain level controlled Vital Signs Assessment: post-procedure vital signs reviewed and stable Respiratory status: spontaneous breathing, nonlabored ventilation and respiratory function stable Cardiovascular status: blood pressure returned to baseline Postop Assessment: no apparent nausea or vomiting Anesthetic complications: no   No notable events documented.  Last Vitals:  Vitals:   05/12/24 0845 05/12/24 0900  BP: 128/80 120/67  Pulse: 86 81  Resp: 19 20  Temp:    SpO2: 97% 100%    Last Pain:  Vitals:   05/12/24 0900  TempSrc:   PainSc: 0-No pain                 Vertell Row

## 2024-05-12 NOTE — Anesthesia Procedure Notes (Signed)
 Procedure Name: Intubation Date/Time: 05/12/2024 7:42 AM  Performed by: Deeann Eva BROCKS, CRNAPre-anesthesia Checklist: Patient identified, Emergency Drugs available, Suction available and Patient being monitored Patient Re-evaluated:Patient Re-evaluated prior to induction Oxygen Delivery Method: Circle System Utilized Preoxygenation: Pre-oxygenation with 100% oxygen Induction Type: IV induction Ventilation: Mask ventilation without difficulty Laryngoscope Size: Mac and 3 Grade View: Grade II Tube type: Oral Tube size: 7.0 mm Number of attempts: 1 Airway Equipment and Method: Stylet Placement Confirmation: ETT inserted through vocal cords under direct vision, positive ETCO2 and breath sounds checked- equal and bilateral Secured at: 20 cm Tube secured with: Tape Dental Injury: Teeth and Oropharynx as per pre-operative assessment

## 2024-05-13 ENCOUNTER — Encounter (HOSPITAL_COMMUNITY): Payer: Self-pay | Admitting: Obstetrics and Gynecology

## 2024-05-13 LAB — SURGICAL PATHOLOGY

## 2024-05-14 ENCOUNTER — Ambulatory Visit: Payer: Self-pay | Admitting: Obstetrics and Gynecology

## 2024-05-15 ENCOUNTER — Inpatient Hospital Stay: Attending: Hematology and Oncology | Admitting: Hematology and Oncology

## 2024-05-15 ENCOUNTER — Inpatient Hospital Stay

## 2024-05-15 VITALS — BP 122/78 | HR 81 | Temp 97.1°F | Resp 14 | Wt 198.1 lb

## 2024-05-15 DIAGNOSIS — Z86718 Personal history of other venous thrombosis and embolism: Secondary | ICD-10-CM | POA: Insufficient documentation

## 2024-05-15 DIAGNOSIS — D500 Iron deficiency anemia secondary to blood loss (chronic): Secondary | ICD-10-CM

## 2024-05-15 LAB — CMP (CANCER CENTER ONLY)
ALT: 21 U/L (ref 0–44)
AST: 17 U/L (ref 15–41)
Albumin: 4.1 g/dL (ref 3.5–5.0)
Alkaline Phosphatase: 85 U/L (ref 38–126)
Anion gap: 9 (ref 5–15)
BUN: 9 mg/dL (ref 6–20)
CO2: 24 mmol/L (ref 22–32)
Calcium: 8.8 mg/dL — ABNORMAL LOW (ref 8.9–10.3)
Chloride: 104 mmol/L (ref 98–111)
Creatinine: 0.65 mg/dL (ref 0.44–1.00)
GFR, Estimated: >60 mL/min (ref >60)
Glucose, Bld: 84 mg/dL (ref 70–99)
Potassium: 3.8 mmol/L (ref 3.5–5.1)
Sodium: 138 mmol/L (ref 135–145)
Total Bilirubin: <0.2 mg/dL (ref 0.0–1.2)
Total Protein: 7 g/dL (ref 6.5–8.1)

## 2024-05-15 LAB — CBC WITH DIFFERENTIAL (CANCER CENTER ONLY)
Abs Immature Granulocytes: 0.03 K/uL (ref 0.00–0.07)
Basophils Absolute: 0.1 K/uL (ref 0.0–0.1)
Basophils Relative: 1 %
Eosinophils Absolute: 0.2 K/uL (ref 0.0–0.5)
Eosinophils Relative: 2 %
HCT: 32.7 % — ABNORMAL LOW (ref 36.0–46.0)
Hemoglobin: 10.4 g/dL — ABNORMAL LOW (ref 12.0–15.0)
Immature Granulocytes: 0 %
Lymphocytes Relative: 24 %
Lymphs Abs: 2.2 K/uL (ref 0.7–4.0)
MCH: 22.8 pg — ABNORMAL LOW (ref 26.0–34.0)
MCHC: 31.8 g/dL (ref 30.0–36.0)
MCV: 71.7 fL — ABNORMAL LOW (ref 80.0–100.0)
Monocytes Absolute: 0.6 K/uL (ref 0.1–1.0)
Monocytes Relative: 7 %
Neutro Abs: 6.2 K/uL (ref 1.7–7.7)
Neutrophils Relative %: 66 %
Platelet Count: 375 K/uL (ref 150–400)
RBC: 4.56 MIL/uL (ref 3.87–5.11)
RDW: 14.2 % (ref 11.5–15.5)
WBC Count: 9.2 K/uL (ref 4.0–10.5)
nRBC: 0 % (ref 0.0–0.2)

## 2024-05-15 LAB — RETIC PANEL
Immature Retic Fract: 18.7 % — ABNORMAL HIGH (ref 2.3–15.9)
RBC.: 4.58 MIL/uL (ref 3.87–5.11)
Retic Count, Absolute: 62.7 K/uL (ref 19.0–186.0)
Retic Ct Pct: 1.4 % (ref 0.4–3.1)
Reticulocyte Hemoglobin: 20.8 pg — ABNORMAL LOW (ref >27.9)

## 2024-05-15 LAB — IRON AND IRON BINDING CAPACITY (CC-WL,HP ONLY)
Iron: 15 ug/dL — ABNORMAL LOW (ref 28–170)
Saturation Ratios: 3 % — ABNORMAL LOW (ref 10.4–31.8)
TIBC: 545 ug/dL — ABNORMAL HIGH (ref 250–450)
UIBC: 530 ug/dL

## 2024-05-15 LAB — FERRITIN: Ferritin: 8 ng/mL — ABNORMAL LOW (ref 11–307)

## 2024-05-15 NOTE — Progress Notes (Signed)
 [facility] [phone]   [phone]  INITIAL CONSULT NOTE  Patient Care Team: Patient, No Pcp Per as PCP - General (General Practice) Izell Harari, MD as PCP - OBGYN (Obstetrics and Gynecology)  Hematological/Oncological History # History of RLE DVT  12/18/2023: history of RLE DVT.  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  05/15/2024: Establish care with Dr. Federico  CHIEF COMPLAINTS/PURPOSE OF CONSULTATION:  History of DVT    HISTORY OF PRESENTING ILLNESS:  Lisa Massey 33 y.o. female with no remarkable past medical history who presents for evaluation of a history of a right lower extremity DVT in July 2025.   On review of the previous records Lisa Massey was diagnosed with a DVT on 12/18/2023 she was pregnant at the time and had also developed pyelonephritis.  The ultrasound of the right lower extremity showed an occlusive posterior tibial and peroneal vein on ultrasound.  She was started on Lovenox  therapy due to the pregnancy and is continued on it until this time.  She recently had labs drawn on 05/12/2024 which showed a white blood cell count 8.1, hemoglobin 10.1, MCV 74.7, and platelets of 304.  Due to concern for these findings the patient was referred to hematology for further evaluation and management.  On exam today Lisa Massey reports that she gave birth to a healthy baby boy who is now 18 months old.  She is not currently breast-feeding as the baby did not wish to take the breast.  She only tried for about 1 month's time.  She reports he is currently on Lovenox  twice daily and tolerating it well with no major bleeding, bruising, or dark stools.  She does have some occasional twinges of pain in her right leg with no swelling.  She is not having any shortness of breath or chest pain at this time.  On  further discussion she reports that her mother has hypertension and her father passed away of a heart attack.  She reports that her other children are healthy.  She had a sister who had cancer of unclear etiology but had to have an ovary removed.  Additionally she had a paternal grandmother who had ulcer/gastritis but was also suspected of having cancer.  She is a never smoker never drinker and currently works as a futures trader.  She otherwise denies any fevers, chills, sweats, nausea, vomiting or diarrhea.  A full 10 point ROS is otherwise negative.  MEDICAL HISTORY:  Past Medical History:  Diagnosis Date   History of DVT (deep vein thrombosis) 12/18/2023   RLE   Medical history non-contributory     SURGICAL HISTORY: Past Surgical History:  Procedure Laterality Date   APPENDECTOMY     LAPAROSCOPIC BILATERAL SALPINGECTOMY Bilateral 05/12/2024   Procedure: SALPINGECTOMY, BILATERAL, LAPAROSCOPIC;  Surgeon: Jeralyn Crutch, MD;  Location: [facility] OR;  Service: Gynecology;  Laterality: Bilateral;   nexplanon       inserted 11/2018 per patient    SOCIAL HISTORY: Social History   Socioeconomic History   Marital status: Single    Spouse name: Not on file   Number of children: Not on file   Years of education: Not on file   Highest education level: Not on file  Occupational History   Not on file  Tobacco Use   Smoking status: Never   Smokeless tobacco: Never  Vaping Use  Vaping status: Never Used  Substance and Sexual Activity   Alcohol use: No   Drug use: No   Sexual activity: Not Currently    Partners: Male    Birth control/protection: Surgical    Comment: She wants a tubal ligation  Other Topics Concern   Not on file  Social History Narrative   Not on file   Social Drivers of Health   Tobacco Use: Low Risk (05/12/2024)   Patient History    Smoking Tobacco Use: Never    Smokeless Tobacco Use: Never    Passive Exposure: Not on file  Financial Resource Strain: Not on file   Food Insecurity: Food Insecurity Present (03/26/2024)   Epic    Worried About Programme Researcher, Broadcasting/film/video in the Last Year: Sometimes true    Ran Out of Food in the Last Year: Sometimes true  Transportation Needs: No Transportation Needs (01/12/2024)   Epic    Lack of Transportation (Medical): No    Lack of Transportation (Non-Medical): No  Physical Activity: Not on file  Stress: Not on file  Social Connections: Not on file  Intimate Partner Violence: Not At Risk (01/12/2024)   Epic    Fear of Current or Ex-Partner: No    Emotionally Abused: No    Physically Abused: No    Sexually Abused: No  Depression (PHQ2-9): Low Risk (05/15/2024)   Depression (PHQ2-9)    PHQ-2 Score: 0  Alcohol Screen: Not on file  Housing: Low Risk (01/13/2024)   Epic    Unable to Pay for Housing in the Last Year: No    Number of Times Moved in the Last Year: 0    Homeless in the Last Year: No  Utilities: Not At Risk (01/12/2024)   Epic    Threatened with loss of utilities: No  Health Literacy: Not on file    FAMILY HISTORY: Family History  Problem Relation Age of Onset   Hypertension Father    Cancer Sister        ovarian   Diabetes Maternal Grandmother    Asthma Neg Hx    Heart disease Neg Hx    Stroke Neg Hx     ALLERGIES:  has no known allergies.  MEDICATIONS:  Current Outpatient Medications  Medication Sig Dispense Refill   enoxaparin  (LOVENOX ) 40 MG/0.4ML injection Inject 0.4 mLs (40 mg total) into the skin daily. 4 mL 0   ibuprofen  (ADVIL ) 600 MG tablet Take 1 tablet (600 mg total) by mouth every 6 (six) hours as needed. 30 tablet 1   Prenatal Vit-Fe Fumarate-FA (MULTIVITAMIN-PRENATAL) 27-0.8 MG TABS tablet Take 1 tablet by mouth daily in the afternoon.     acetaminophen  (TYLENOL ) 500 MG tablet Take 1-2 tablets (500-1,000 mg total) by mouth every 6 (six) hours as needed. (Patient not taking: Reported on 05/15/2024) 60 tablet 0   oxyCODONE  (OXY IR/ROXICODONE ) 5 MG immediate release tablet Take 1  tablet (5 mg total) by mouth every 4 (four) hours as needed for severe pain (pain score 7-10) or breakthrough pain. (Patient not taking: Reported on 05/15/2024) 6 tablet 0   No current facility-administered medications for this visit.    REVIEW OF SYSTEMS:   Constitutional: ( - ) fevers, ( - )  chills , ( - ) night sweats Eyes: ( - ) blurriness of vision, ( - ) double vision, ( - ) watery eyes Ears, nose, mouth, throat, and face: ( - ) mucositis, ( - ) sore throat Respiratory: ( - ) cough, ( - )  dyspnea, ( - ) wheezes Cardiovascular: ( - ) palpitation, ( - ) chest discomfort, ( - ) lower extremity swelling Gastrointestinal:  ( - ) nausea, ( - ) heartburn, ( - ) change in bowel habits Skin: ( - ) abnormal skin rashes Lymphatics: ( - ) new lymphadenopathy, ( - ) easy bruising Neurological: ( - ) numbness, ( - ) tingling, ( - ) new weaknesses Behavioral/Psych: ( - ) mood change, ( - ) new changes  All other systems were reviewed with the patient and are negative.  PHYSICAL EXAMINATION:  Vitals:   05/15/24 1404  BP: 122/78  Pulse: 81  Resp: 14  Temp: (!) 97.1 F (36.2 C)  SpO2: 100%   Filed Weights   05/15/24 1404  Weight: 198 lb 1.6 oz (89.9 kg)    GENERAL: well appearing young Hispanic female in NAD  SKIN: skin color, texture, turgor are normal, no rashes or significant lesions EYES: conjunctiva are pink and non-injected, sclera clear LUNGS: clear to auscultation and percussion with normal breathing effort HEART: regular rate & rhythm and no murmurs and no lower extremity edema Musculoskeletal: no cyanosis of digits and no clubbing  PSYCH: alert & oriented x 3, fluent speech NEURO: no focal motor/sensory deficits  LABORATORY DATA:  I have reviewed the data as listed    Latest Ref Rng & Units 05/15/2024    3:23 PM 05/12/2024    5:49 AM 01/13/2024    4:35 AM  CBC  WBC 4.0 - 10.5 K/uL 9.2  8.1  12.0   Hemoglobin 12.0 - 15.0 g/dL 89.5  89.8  8.7   Hematocrit 36.0 - 46.0  % 32.7  32.5  26.2   Platelets 150 - 400 K/uL 375  304  173        Latest Ref Rng & Units 05/15/2024    3:23 PM 01/12/2024    2:18 PM 12/14/2023   11:41 PM  CMP  Glucose 70 - 99 mg/dL 84  86  877   BUN 6 - 20 mg/dL 9  5  <5   Creatinine 9.55 - 1.00 mg/dL 9.34  9.33  9.52   Sodium 135 - 145 mmol/L 138  136  132   Potassium 3.5 - 5.1 mmol/L 3.8  3.9  3.6   Chloride 98 - 111 mmol/L 104  110  105   CO2 22 - 32 mmol/L 24  19  18    Calcium 8.9 - 10.3 mg/dL 8.8  8.9  8.9   Total Protein 6.5 - 8.1 g/dL 7.0  6.1    Total Bilirubin 0.0 - 1.2 mg/dL <9.7  0.2    Alkaline Phos 38 - 126 U/L 85  124    AST 15 - 41 U/L 17  19    ALT 0 - 44 U/L 21  13       ASSESSMENT & PLAN Lisa Massey 33 y.o. female with no remarkable past medical history who presents for evaluation of a history of a right lower extremity DVT in July 2025.   After review of the labs, review of the records, and discussion with the patient the patients findings are most consistent with a lower extremity DVT provoked by pregnancy and infection.  A provoked venous thromboembolism (VTE) is one that has a clear inciting factor or event. Provoking factors include prolonged travel/immobility, surgery (particularly abdominal or orthopedic), trauma,  and pregnancy/ estrogen containing birth control. This patient was reported to have been pregnant with pyleonephritis, which would qualify  as a transient provoking factor. As such we would recommend 3-6 months of anticoagulation therapy with consideration of additional therapy if symptoms persist. The anticoagulation therapy of choice in this situation is lovenox  (when pregnant).   # Provoked Right Lower Extremity DVT  --findings at this time are consistent with a provoked VTE.  This was provoked by pregnancy as well as pyelonephritis. --Okay to continue with Lovenox  1 mg/kg dosing for 10 days following her surgery as recommended by her surgical team.  After that time she can safely  discontinue the medication. --patient denies any bleeding, bruising, or dark stools on this medication. It is well tolerated. No difficulties accessing/affording the medication  --Would recommend avoiding the use of supplemental estrogen in the future as this increases the patient's clotting during pregnancy.  This includes estrogen based OCPs and hormonal replacement therapy --Patient recently underwent tubal ligation, however there would be special precautions if she were to become pregnant again.  Highly unlikely status post tubal ligation.  # Iron Deficiency Anemia 2/2 to GYN Bleeding -- Findings are consistent with iron deficiency anemia secondary to patient's menorrhagia --Encouraged her to follow-up with OB/GYN for better control of her menstrual cycles --We will confirm iron deficiency anemia by ordering iron panel and ferritin as well as reticulocytes, CBC, and CMP -- If found to be iron deficient would recommend ferrous sulfate  325 mg daily with a source of vitamin C -- If p.o. iron is adequate recommend consideration of IV iron therapy. -- Return to clinic in 3 months time if patient is iron deficient.   Orders Placed This Encounter  Procedures   CBC with Differential (Cancer Center Only)    Standing Status:   Future    Number of Occurrences:   1    Expiration Date:   05/15/2025   CMP (Cancer Center only)    Standing Status:   Future    Number of Occurrences:   1    Expiration Date:   05/15/2025   Ferritin    Standing Status:   Future    Number of Occurrences:   1    Expiration Date:   05/15/2025   Iron and Iron Binding Capacity ([facility] only)    Standing Status:   Future    Number of Occurrences:   1    Expiration Date:   05/15/2025   Retic Panel    Standing Status:   Future    Number of Occurrences:   1    Expiration Date:   05/15/2025    All questions were answered. The patient knows to call the clinic with any problems, questions or concerns.  A total of more  than 60 minutes were spent on this encounter with face-to-face time and non-face-to-face time, including preparing to see the patient, ordering tests and/or medications, counseling the patient and coordination of care as outlined above.   Norleen IVAR Kidney, MD Department of Hematology/Oncology [facility] Phone: [phone] Pager: [phone] Email: [email or web address]  05/15/2024 4:40 PM

## 2024-05-16 ENCOUNTER — Telehealth: Payer: Self-pay | Admitting: Physician Assistant

## 2024-05-16 NOTE — Telephone Encounter (Signed)
 I SPOKE WITH PATIENT VIA INTERPRETING SERVICES AND SHE IS AWARE OF 08/08/24 AND 08/15/24 APPOINTMENTS FOR LAB AND PA.

## 2024-05-26 ENCOUNTER — Ambulatory Visit: Payer: Self-pay

## 2024-05-26 ENCOUNTER — Telehealth: Payer: Self-pay

## 2024-05-26 ENCOUNTER — Other Ambulatory Visit: Payer: Self-pay

## 2024-05-26 MED ORDER — FERROUS SULFATE 325 (65 FE) MG PO TBEC: 325.0000 mg | DELAYED_RELEASE_TABLET | Freq: Every day | ORAL | 3 refills | Status: AC

## 2024-05-26 NOTE — Telephone Encounter (Signed)
 TC to pt using phone interpreter for Spanish.  Left message for pt to return call to office regarding the following lab results and instructions per Dr. Federico.    Please let Mrs. Calley Drenning know that her blood work is most consistent with iron deficiency anemia.  I would recommend she start p.o. ferrous sulfate  325 mg daily with a source of vitamin C.  If she  has trouble tolerating this please have her call and let us  know and we can arrange IV iron therapy.  Will plan to see her back in 3 months time and we will do assure her iron levels are improving.   Scheduling notified .  My Chart message sent.  Iron Rx sent to pt's pharmacy.

## 2024-05-26 NOTE — Telephone Encounter (Signed)
 Pt returned call.  Phone interpreter used and reviewed MY Chart information and recent labs with plan of care per Dr Federico. Pt voiced understanding.

## 2024-05-26 NOTE — Telephone Encounter (Signed)
-----   Message from Nurse Almarie DASEN, RN sent at 05/26/2024 10:01 AM EST -----  ----- Message ----- From: Federico Norleen DASEN MADISON, MD Sent: 05/15/2024   4:41 PM EST To: Almarie DELENA Arabia, RN  Please let Mrs. Lisa Massey know that her blood work is most consistent with iron deficiency anemia.  I would recommend she start p.o. ferrous sulfate  325 mg daily with a source of vitamin C.  If she  has trouble tolerating this please have her call and let us  know and we can arrange IV iron therapy.  Will plan to see her back in 3 months time and we will do assure her iron levels are improving.

## 2024-05-28 ENCOUNTER — Encounter: Payer: Self-pay | Admitting: Family

## 2024-05-28 ENCOUNTER — Ambulatory Visit: Admitting: Family

## 2024-05-28 VITALS — BP 120/78 | HR 79 | Temp 97.5°F | Ht 62.0 in | Wt 197.2 lb

## 2024-05-28 DIAGNOSIS — Z23 Encounter for immunization: Secondary | ICD-10-CM | POA: Diagnosis not present

## 2024-05-28 DIAGNOSIS — Z9851 Tubal ligation status: Secondary | ICD-10-CM | POA: Diagnosis not present

## 2024-05-28 DIAGNOSIS — D500 Iron deficiency anemia secondary to blood loss (chronic): Secondary | ICD-10-CM

## 2024-05-28 NOTE — Progress Notes (Signed)
 "  New Patient Office Visit  Subjective:  Patient ID: Lisa Massey, female    DOB: 1990/11/10  Age: 33 y.o. MRN: [id number]  CC:  Chief Complaint  Patient presents with   New Patient (Initial Visit)   HPI [name] presents for establishing care today.  Discussed the use of AI scribe software for clinical note transcription with the patient, who gave verbal consent to proceed.  History of Present Illness Lisa Massey is a 33 year old female who presents to establish care.  She recently underwent a sterilization procedure about two weeks ago and has had no vaginal bleeding. She has iron deficiency managed by a hematologist and takes a daily iron supplement without nausea or constipation. She is currently not working and has four children.  Assessment & Plan Iron deficiency anemia due to chronic blood loss Iron deficiency anemia secondary to chronic blood loss. Managed by hematologist with daily iron supplements. Tolerated well. Advised on vitamin C for absorption and hydration to prevent constipation. Informed about normal dark stools from iron. - Continue daily iron supplementation with vitamin C. - Ensure hydration with 2 to 2.5 liters of water daily. - Monitor for constipation, manage with increased water intake. - Recheck iron levels in six months or with Hematology follow up.  General Health Maintenance Establishing care with no immediate concerns. Recent labs completed, no additional labs needed today. - Schedule physical exam with fasting labs in six months. - Provided clinic information summary in Spanish. - Advised to schedule follow-up appointments as needed.  Need for influenza vaccine - Received flu shot today   Subjective:    Outpatient Medications Prior to Visit  Medication Sig Dispense Refill   ferrous sulfate  325 (65 FE) MG EC tablet Take 1 tablet (325 mg total) by mouth daily with breakfast. Take with a source of vitamin C 90 tablet 3    enoxaparin  (LOVENOX ) 40 MG/0.4ML injection Inject 0.4 mLs (40 mg total) into the skin daily. 4 mL 0   ibuprofen  (ADVIL ) 600 MG tablet Take 1 tablet (600 mg total) by mouth every 6 (six) hours as needed. 30 tablet 1   Prenatal Vit-Fe Fumarate-FA (MULTIVITAMIN-PRENATAL) 27-0.8 MG TABS tablet Take 1 tablet by mouth daily in the afternoon.     acetaminophen  (TYLENOL ) 500 MG tablet Take 1-2 tablets (500-1,000 mg total) by mouth every 6 (six) hours as needed. (Patient not taking: Reported on 05/15/2024) 60 tablet 0   oxyCODONE  (OXY IR/ROXICODONE ) 5 MG immediate release tablet Take 1 tablet (5 mg total) by mouth every 4 (four) hours as needed for severe pain (pain score 7-10) or breakthrough pain. (Patient not taking: Reported on 05/15/2024) 6 tablet 0   No facility-administered medications prior to visit.   Past Medical History:  Diagnosis Date   History of DVT (deep vein thrombosis) 12/18/2023   RLE   History of postpartum hemorrhage, currently pregnant 07/25/2023   Due to atony  [ ]  TXA at delivery     Medical history non-contributory    Pyelonephritis 12/18/2023   Presented on 7/21 fever and flank pain and was diagnosed with pyelonephritis  Patient was started on ceftriaxone , however patient refevered and was broadened to Zosyn on hospital day 2.     On hospital day 3 patient was transition to oral Keflex  4 times daily after susceptibilities resulted (pan susceptible).     Antibiotic regimen: Keflex  500 mg 4 times daily x 14 days followed by Macrobid 100 mg   Unwanted fertility 02/20/2024  Past Surgical History:  Procedure Laterality Date   APPENDECTOMY     LAPAROSCOPIC BILATERAL SALPINGECTOMY Bilateral 05/12/2024   Procedure: SALPINGECTOMY, BILATERAL, LAPAROSCOPIC;  Surgeon: Jeralyn Crutch, MD;  Location: [facility] OR;  Service: Gynecology;  Laterality: Bilateral;   nexplanon       inserted 11/2018 per patient    Objective:   Today's Vitals: BP 120/78 (BP Location: Left Arm, Patient Position:  Sitting, Cuff Size: Large)   Pulse 79   Temp (!) 97.5 F (36.4 C) (Temporal)   Ht 5' 2 (1.575 m)   Wt 197 lb 4 oz (89.5 kg)   LMP 05/24/2024 (Exact Date)   SpO2 97%   Breastfeeding No   BMI 36.08 kg/m   Physical Exam Vitals and nursing note reviewed.  Constitutional:      Appearance: Normal appearance. She is obese.  Cardiovascular:     Rate and Rhythm: Normal rate and regular rhythm.  Pulmonary:     Effort: Pulmonary effort is normal.     Breath sounds: Normal breath sounds.  Musculoskeletal:        General: Normal range of motion.  Skin:    General: Skin is warm and dry.  Neurological:     Mental Status: She is alert.  Psychiatric:        Mood and Affect: Mood normal.        Behavior: Behavior normal.    Lucius Krabbe, NP "

## 2024-05-28 NOTE — Patient Instructions (Addendum)
 Bienvenido a Insurance Claims Handler en [place]. Fue un placer conocerlo hoy!  Por favor, programe una cita para un examen fsico con anlisis de laboratorio en ayunas en aproximadamente 6 meses.  Feliz Ao Nuevo!  NOTA IMPORTANTE: Si se realiz alguna prueba de laboratorio, avsenos si no recibe [employer]. Es posible que vea sus resultados en MyChart antes de que los revisemos, pero le llamaremos una vez que los hayamos revisado. Si solicitamos referencias hoy, avsenos si no ha tenido noticias de su consultorio en la prxima semana. Avsenos a travs de MyChart si necesita resurtidos o pdale a su farmacia que nos enve la solicitud. Tambin puede usar MyChart para comunicarse conmigo o con el personal de la oficina.  Pruebe estos consejos para mantener un estilo de vida saludable:  Es importante que haga ejercicio regularmente al menos 30 minutos 5 veces por semana.  Piense en lo que va a comer y planifique con anticipacin.  Elija alimentos integrales y opte por alimentos limpios, verdes, frescos o diplomatic services operational officer de alimentos enlatados, procesados o envasados, que son ms azucarados, salados y scientist, physiological.  Entre el 70 y el 75 % de los alimentos consumidos deben ser verduras frescas y protenas.  De 2 a 3 comidas diarias con refrigerios saludables entre comidas, pero deben ser frutas, protenas o verduras enteras.  Intente comer durante un perodo de 10 horas cuando est activo, por ejemplo, de 7 a. m. a 5 p. m., y luego DETNGASE despus de su ltima comida del da, bebiendo solo agua.  Se ha demostrado que las ventanas de alimentacin ms cortas, de 6 a 8 horas, benefician las enfermedades cardacas y la regulacin del azcar en sangre.  Marathon oil! Aspire a beber 64 onzas diarias = 8 vasos; ninguna otra bebida es tan saludable! El jugo de fruta se disfruta mejor de manera saludable, COMIENDO la fruta.

## 2024-06-10 ENCOUNTER — Other Ambulatory Visit: Payer: Self-pay

## 2024-06-10 ENCOUNTER — Ambulatory Visit: Payer: Self-pay | Admitting: Obstetrics and Gynecology

## 2024-06-10 VITALS — BP 116/72 | HR 79 | Wt 198.0 lb

## 2024-06-10 DIAGNOSIS — Z09 Encounter for follow-up examination after completed treatment for conditions other than malignant neoplasm: Secondary | ICD-10-CM | POA: Diagnosis not present

## 2024-06-10 DIAGNOSIS — Z758 Other problems related to medical facilities and other health care: Secondary | ICD-10-CM | POA: Diagnosis not present

## 2024-06-10 DIAGNOSIS — Z86718 Personal history of other venous thrombosis and embolism: Secondary | ICD-10-CM

## 2024-06-10 DIAGNOSIS — Z603 Acculturation difficulty: Secondary | ICD-10-CM

## 2024-06-10 DIAGNOSIS — Z3009 Encounter for other general counseling and advice on contraception: Secondary | ICD-10-CM

## 2024-06-10 NOTE — Progress Notes (Signed)
" ° °  POSTOPERATIVE VISIT NOTE   Subjective:     Lisa Massey is a 34 y.o. [phone] who presents to the clinic 4 weeks status post bilateral salpingectomy for requested sterilization. Eating a regular diet without difficulty. Bowel movements are normal. The patient is not having any pain. Incision: initially had pain in umbilicus but that has improved Vaginal bleeding: none Resumed sexual acitivity: not yet, wondering when she can resume   The following portions of the patient's history were reviewed and updated as appropriate: allergies, current medications, past family history, past medical history, past social history, past surgical history, and problem list..   Review of Systems Pertinent items are noted in HPI.    Objective:    BP 116/72   Pulse 79   Wt 198 lb (89.8 kg)   LMP 05/24/2024 (Exact Date)   BMI 36.21 kg/m  General:  alert, cooperative, and no distress  Abdomen: soft, bowel sounds active, non-tender  Incision:   healing well, no drainage, no erythema, no hernia, no seroma, no swelling, no dehiscence, incision well approximated  Pelvic:   Exam deferred.    Pathology Results: FINAL MICROSCOPIC DIAGNOSIS:   A. FALLOPIAN TUBES, BILATERAL, SALPINGECTOMY:  Two unremarkable fallopian tubes.    Assessment:   Doing well postoperatively. Operative findings again reviewed. Pathology report discussed.   Plan:    1. Postop check (Primary) Doing well, pain well controlled Reviewed intra-op photos and pathology  2. Unwanted fertility Benign fallopian tubes on pathology w/ immediate contraceptive benefit  3. History of DVT (deep vein thrombosis) S/p LVX postop   4. Language barrier Video interpreter used for encounter    Activity restrictions: none Follow up: as needed  Aleyah Balik, MD Obstetrician & Gynecologist, [facility] for [employer], [facility] "

## 2024-08-08 ENCOUNTER — Inpatient Hospital Stay

## 2024-08-15 ENCOUNTER — Inpatient Hospital Stay: Admitting: Physician Assistant
